# Patient Record
Sex: Female | Born: 1935
Health system: Southern US, Community
[De-identification: ages and names within clinical notes are randomized; demographics above are authoritative.]

## PROBLEM LIST (undated history)

## (undated) DIAGNOSIS — M8580 Other specified disorders of bone density and structure, unspecified site: Secondary | ICD-10-CM

## (undated) DIAGNOSIS — M1990 Unspecified osteoarthritis, unspecified site: Secondary | ICD-10-CM

## (undated) HISTORY — PX: TUBAL LIGATION: SHX77

## (undated) HISTORY — PX: APPENDECTOMY: SHX54

## (undated) HISTORY — DX: Other specified disorders of bone density and structure, unspecified site: M85.80

## (undated) HISTORY — PX: BREAST CYST EXCISION: SHX579

## (undated) HISTORY — PX: BUNIONECTOMY: SHX129

---

## 2001-01-25 ENCOUNTER — Ambulatory Visit (HOSPITAL_COMMUNITY): Admission: RE | Admit: 2001-01-25 | Discharge: 2001-01-25 | Payer: Self-pay | Admitting: Ophthalmology

## 2001-08-22 ENCOUNTER — Ambulatory Visit (HOSPITAL_COMMUNITY): Admission: RE | Admit: 2001-08-22 | Discharge: 2001-08-22 | Payer: Self-pay | Admitting: Family Medicine

## 2001-08-22 ENCOUNTER — Encounter: Payer: Self-pay | Admitting: Family Medicine

## 2002-11-15 ENCOUNTER — Ambulatory Visit (HOSPITAL_COMMUNITY): Admission: RE | Admit: 2002-11-15 | Discharge: 2002-11-15 | Payer: Self-pay | Admitting: Family Medicine

## 2002-11-15 ENCOUNTER — Encounter: Payer: Self-pay | Admitting: Family Medicine

## 2004-01-22 ENCOUNTER — Ambulatory Visit (HOSPITAL_COMMUNITY): Admission: RE | Admit: 2004-01-22 | Discharge: 2004-01-22 | Payer: Self-pay | Admitting: Family Medicine

## 2005-02-09 ENCOUNTER — Ambulatory Visit (HOSPITAL_COMMUNITY): Admission: RE | Admit: 2005-02-09 | Discharge: 2005-02-09 | Payer: Self-pay | Admitting: Family Medicine

## 2006-03-05 ENCOUNTER — Ambulatory Visit (HOSPITAL_COMMUNITY): Admission: RE | Admit: 2006-03-05 | Discharge: 2006-03-05 | Payer: Self-pay | Admitting: Family Medicine

## 2006-05-20 ENCOUNTER — Ambulatory Visit: Payer: Self-pay | Admitting: Internal Medicine

## 2006-05-24 HISTORY — PX: ESOPHAGOGASTRODUODENOSCOPY: SHX1529

## 2006-06-04 ENCOUNTER — Ambulatory Visit: Payer: Self-pay | Admitting: Internal Medicine

## 2006-06-04 ENCOUNTER — Ambulatory Visit (HOSPITAL_COMMUNITY): Admission: RE | Admit: 2006-06-04 | Discharge: 2006-06-04 | Payer: Self-pay | Admitting: Internal Medicine

## 2006-09-29 ENCOUNTER — Ambulatory Visit (HOSPITAL_COMMUNITY): Admission: RE | Admit: 2006-09-29 | Discharge: 2006-09-29 | Payer: Self-pay | Admitting: Family Medicine

## 2007-03-23 ENCOUNTER — Ambulatory Visit (HOSPITAL_COMMUNITY): Admission: RE | Admit: 2007-03-23 | Discharge: 2007-03-23 | Payer: Self-pay | Admitting: Family Medicine

## 2008-03-27 ENCOUNTER — Ambulatory Visit (HOSPITAL_COMMUNITY): Admission: RE | Admit: 2008-03-27 | Discharge: 2008-03-27 | Payer: Self-pay | Admitting: Family Medicine

## 2009-04-16 ENCOUNTER — Ambulatory Visit (HOSPITAL_COMMUNITY): Admission: RE | Admit: 2009-04-16 | Discharge: 2009-04-16 | Payer: Self-pay | Admitting: Family Medicine

## 2010-04-17 ENCOUNTER — Ambulatory Visit (HOSPITAL_COMMUNITY): Admission: RE | Admit: 2010-04-17 | Discharge: 2010-04-17 | Payer: Self-pay | Admitting: Family Medicine

## 2010-07-24 ENCOUNTER — Ambulatory Visit (HOSPITAL_COMMUNITY): Admission: RE | Admit: 2010-07-24 | Discharge: 2010-07-24 | Payer: Self-pay | Admitting: Family Medicine

## 2010-11-12 ENCOUNTER — Ambulatory Visit (HOSPITAL_COMMUNITY)
Admission: RE | Admit: 2010-11-12 | Discharge: 2010-11-12 | Disposition: A | Discharge status: Home / Self Care | Payer: Medicare Other | Source: Ambulatory Visit | Attending: Family Medicine | Admitting: Family Medicine

## 2010-11-12 ENCOUNTER — Other Ambulatory Visit (HOSPITAL_COMMUNITY): Payer: Self-pay | Admitting: Family Medicine

## 2010-11-12 DIAGNOSIS — R05 Cough: Secondary | ICD-10-CM

## 2010-11-12 DIAGNOSIS — R062 Wheezing: Secondary | ICD-10-CM | POA: Insufficient documentation

## 2010-11-12 DIAGNOSIS — R059 Cough, unspecified: Secondary | ICD-10-CM | POA: Insufficient documentation

## 2010-11-12 DIAGNOSIS — R112 Nausea with vomiting, unspecified: Secondary | ICD-10-CM | POA: Insufficient documentation

## 2011-01-09 NOTE — H&P (Signed)
NAME:  Nancy Kirby, Nancy Kirby                ACCOUNT NO.:  [id number]   MEDICAL RECORD NO.:  [id number]          PATIENT TYPE:  AMB   LOCATION:  DAY                           FACILITY:  [facility]   PHYSICIAN:  Lionel December, M.D.    DATE OF BIRTH:  07-Nov-1926   DATE OF ADMISSION:  DATE OF DISCHARGE:  LH                                HISTORY & PHYSICAL   PRESENTING COMPLAINT:  Solid food dysphagia.   HISTORY OF PRESENT ILLNESS:  Heran is a 77 year old Caucasian female  patient of Dr. Lilyan Punt who is self-referred for evaluation of  dysphagia.  She has a history of dysphagia.  She had EGD/ED in [phone].  She had erosive esophagitis with stricture at GE junction which was dilated  to 18 mm with the balloon, and she also had small sliding hiatal hernia.  She had gastritis, but her H pylori serology was negative.  She was treated  initially with Prilosec and asked to continue H2B, but she has been using  PPI on a p.r.n. basis only when she would eat certain foods that might  trigger her heartburn.  She had done well until about two months ago when  she started to have difficulty with rice and meats.  She has had a few  episodes where she had to regurgitate in order to get relief.  She points to  her mid lower sternal level as the site of bolus obstruction.  She  experiences very little in the way of indigestion, only with certain foods.  She denies nocturnal regurgitation or throat symptoms.  She is taking maybe  a dose or two of Nexium per month.  She has a good appetite.  She denies  melena, rectal bleeding or weight loss.  She did undergo screening  sigmoidoscopy by Dr. Sudie Bailey about seven years ago, but she has never had a  screening colonoscopy, although she has been thinking about having one.   She is on Tums p.r.n.   PAST MEDICAL HISTORY:  Chronic GERD as above.  She had normal bone density  study within the last four to five years.  She had screening flexible  sigmoidoscopy seven  years ago.  Prior surgeries include removal of a benign  cyst from the breast, appendectomy, bilateral bunionectomy.  History of  kidney stones.  She passed one several years ago during her pregnancy.  Second time, she had a stone removed via cystoscopy and third time which was  about 30 years ago she had right pyelolithotomy, and she has not had any  problems since then.  She has also had bilateral cataract surgery.   ALLERGIES:  NK.   FAMILY HISTORY:  Father died at 76.  Mother had bladder carcinoma and died  at age 34.  She has two brothers living and one brother died of carcinoma of  the liver at age 31, another one of lung carcinoma at age 32.   SOCIAL HISTORY:  She is married.  She has three healthy children.  She is an  Astronomer.  She worked at [employer] office for over 14 years,  and she worked as  an Psychiatrist.  Presently, she works part-time at Dr[employer] office.  She has never smoked cigarettes or drank alcohol.   PHYSICAL EXAMINATION:  GENERAL:  Pleasant well-developed, well-nourished  Caucasian female who appears younger than stated age.  She weighs 157-1/2  pounds.  She is 5 feet 6 inches tall.  Pulse 72 per minute, blood pressure  106/68, temperature is 98.  HEENT:  Conjunctivae is pink.  Sclerae is nonicteric.  Oral pharyngeal  mucosa is normal.  Dentition in satisfactory condition.  No neck masses or  thyromegaly noted.  CARDIAC EXAM:  With regular rhythm.  Normal S1 and S2.  No murmur or gallop  noted.  LUNGS:  Are clear to auscultation.  ABDOMEN:  Her abdomen is flat and soft without tenderness, organomegaly or  masses.  EXTREMITIES:  She does not have peripheral edema or clubbing.   ASSESSMENT:  Recent onset of solid food dysphagia.  She has history of  erosive esophagitis, and stricture at GE junction was last dilated in  [id number].  She has done quite well given that she has been on p.r.n.  acid suppression.  I am afraid that she needs to undergo esophageal  dilation  again.  Feel she needs to be on chronic PPI therapy rather than p.r.n. use.   She is average risk for colorectal carcinoma, although family history is  significant for various malignancies other than CRC.  Given that she is in  excellent health, she should consider having screening colonoscopy.   RECOMMENDATIONS:  1. Antireflux measures reinforced.  2. Prevacid samples given 30 mg q.a.m. (since we did not have any Nexium).      When she finishes samples, she may want to try Prilosec OTC 20 mg q.d.  3. EGD with ED in the near future at [facility].  4. She will call us when she is ready to undergo screening colonoscopy.      Lionel December, M.D.  Electronically Signed     NR/MEDQ  D:  05/20/2006  T:  05/20/2006  Job:  [id number]   cc:   Lorin Picket A. Gerda Diss, MD  Fax: [phone]   [facility]  Fax: [phone]

## 2011-01-09 NOTE — Op Note (Signed)
NAME:  Nancy Kirby, Nancy Kirby                ACCOUNT NO.:  [id number]   MEDICAL RECORD NO.:  [id number]          PATIENT TYPE:  AMB   LOCATION:  DAY                           FACILITY:  [facility]   PHYSICIAN:  Lionel December, M.D.    DATE OF BIRTH:  [birth year]   DATE OF PROCEDURE:  06/04/2006  DATE OF DISCHARGE:  06/04/2006                                 OPERATIVE REPORT   PROCEDURE:  Esophagogastroduodenoscopy with esophageal dilation.   INDICATION:  [name] is 77 year old Caucasian female with history of  esophageal stricture whose last EGD was in 1999 now presents with dysphagia.  She uses a PPI on a p.r.n. basis.   Procedure risks were reviewed the patient, informed consent was obtained.   MEDS FOR CONSCIOUS SEDATION:  Benzocaine spray for pharyngeal topical  anesthesia, Demerol 50 mg IV, Versed 2 mg IV.   FINDINGS:  Procedure performed in endoscopy suite.  The patient's vital  signs and O2 sat were monitored during the procedure and remained stable.  The patient was placed in left lateral position and Olympus videoscope was  passed via oropharynx without any difficulty into esophagus.   Esophagus:  Mucosa of the esophagus normal.  There is scarring at just above  GE junction which was at 37 cm with stricture right at the GE junction.  Pictures taken for the record.  She had small sliding hiatal hernia.  Diaphragmatic hiatus was at 39 was wide open.   Stomach.  It was empty and distended very well with insufflation.  Folds of  proximal stomach were normal.  Examination of mucosa at body antrum, pyloric  channel as well as angularis, fundus, cardia was normal.   Duodenum.  Bulbar mucosa was normal.  Scope was passed second part of  duodenum where mucosa and folds normal.   Endoscope was pulled back and into the stomach.  Balloon dilator was passed  through the scope.  Guidewire was pushed into gastric lumen.  Endoscope was  withdrawn into body of esophagus and balloon dilator was  positioned across  distal esophagus.  It was initially insufflated to a diameter of 18 mm.  It  resulted in a very small mucosal disruption.  Subsequently it was  insufflated to 19 mm and disrupting the mucosa at GE junction at 3 o'clock  as well as 6 o'clock.  Pictures taken for the record.  Balloon was deflated  withdrawn.  Endoscope was withdrawn.  The patient tolerated the procedure  well.   FINAL DIAGNOSIS:  Esophageal stricture at the GE junction along with small  sliding hiatal hernia.   The stricture was dilated to 19 mm with a balloon.   RECOMMENDATIONS:  Antireflux measures as before.  I would like for [name]  to stay on a PPI every day.  She can either take Prevacid or Nexium or  Prilosec OTC.   She will call us should dysphagia recur.      Lionel December, M.D.  Electronically Signed     NR/MEDQ  D:  06/04/2006  T:  06/07/2006  Job:  [id number]   cc:   Lorin Picket A.  Gerda Diss, MD  Fax: [phone]

## 2011-03-31 ENCOUNTER — Other Ambulatory Visit: Payer: Self-pay | Admitting: Family Medicine

## 2011-03-31 DIAGNOSIS — Z139 Encounter for screening, unspecified: Secondary | ICD-10-CM

## 2011-04-20 ENCOUNTER — Ambulatory Visit (HOSPITAL_COMMUNITY)
Admission: RE | Admit: 2011-04-20 | Discharge: 2011-04-20 | Disposition: A | Discharge status: Home / Self Care | Payer: Medicare Other | Source: Ambulatory Visit | Attending: Family Medicine | Admitting: Family Medicine

## 2011-04-20 DIAGNOSIS — Z1231 Encounter for screening mammogram for malignant neoplasm of breast: Secondary | ICD-10-CM | POA: Insufficient documentation

## 2011-04-20 DIAGNOSIS — Z139 Encounter for screening, unspecified: Secondary | ICD-10-CM

## 2011-05-28 ENCOUNTER — Encounter (INDEPENDENT_AMBULATORY_CARE_PROVIDER_SITE_OTHER): Payer: Self-pay | Admitting: *Deleted

## 2011-08-27 ENCOUNTER — Encounter (INDEPENDENT_AMBULATORY_CARE_PROVIDER_SITE_OTHER): Payer: Self-pay | Admitting: *Deleted

## 2012-05-13 ENCOUNTER — Other Ambulatory Visit: Payer: Self-pay | Admitting: Family Medicine

## 2012-05-13 DIAGNOSIS — Z139 Encounter for screening, unspecified: Secondary | ICD-10-CM

## 2012-05-23 ENCOUNTER — Ambulatory Visit (HOSPITAL_COMMUNITY)
Admission: RE | Admit: 2012-05-23 | Discharge: 2012-05-23 | Disposition: A | Discharge status: Home / Self Care | Payer: Medicare Other | Source: Ambulatory Visit | Attending: Family Medicine | Admitting: Family Medicine

## 2012-05-23 DIAGNOSIS — Z139 Encounter for screening, unspecified: Secondary | ICD-10-CM

## 2012-05-23 DIAGNOSIS — Z1231 Encounter for screening mammogram for malignant neoplasm of breast: Secondary | ICD-10-CM | POA: Insufficient documentation

## 2012-06-02 DIAGNOSIS — Z23 Encounter for immunization: Secondary | ICD-10-CM | POA: Diagnosis not present

## 2013-05-17 DIAGNOSIS — Z23 Encounter for immunization: Secondary | ICD-10-CM | POA: Diagnosis not present

## 2013-05-25 ENCOUNTER — Other Ambulatory Visit: Payer: Self-pay | Admitting: Family Medicine

## 2013-05-25 DIAGNOSIS — Z139 Encounter for screening, unspecified: Secondary | ICD-10-CM

## 2013-05-30 ENCOUNTER — Ambulatory Visit (HOSPITAL_COMMUNITY)
Admission: RE | Admit: 2013-05-30 | Discharge: 2013-05-30 | Disposition: A | Discharge status: Home / Self Care | Payer: Medicare Other | Source: Ambulatory Visit | Attending: Family Medicine | Admitting: Family Medicine

## 2013-05-30 DIAGNOSIS — Z1231 Encounter for screening mammogram for malignant neoplasm of breast: Secondary | ICD-10-CM | POA: Diagnosis not present

## 2013-05-30 DIAGNOSIS — Z139 Encounter for screening, unspecified: Secondary | ICD-10-CM

## 2014-05-02 ENCOUNTER — Telehealth: Payer: Self-pay | Admitting: Family Medicine

## 2014-05-02 DIAGNOSIS — R5383 Other fatigue: Secondary | ICD-10-CM

## 2014-05-02 DIAGNOSIS — Z0189 Encounter for other specified special examinations: Secondary | ICD-10-CM

## 2014-05-02 DIAGNOSIS — Z79899 Other long term (current) drug therapy: Secondary | ICD-10-CM

## 2014-05-02 DIAGNOSIS — H538 Other visual disturbances: Secondary | ICD-10-CM | POA: Diagnosis not present

## 2014-05-02 DIAGNOSIS — Z961 Presence of intraocular lens: Secondary | ICD-10-CM | POA: Diagnosis not present

## 2014-05-02 DIAGNOSIS — R5381 Other malaise: Secondary | ICD-10-CM

## 2014-05-02 DIAGNOSIS — H3410 Central retinal artery occlusion, unspecified eye: Secondary | ICD-10-CM | POA: Diagnosis not present

## 2014-05-02 NOTE — Telephone Encounter (Signed)
Patient was notified that blood work has been ordered.  

## 2014-05-02 NOTE — Telephone Encounter (Signed)
Talk with pt to see if any specific concerns.(may need additional labs) I have no meds listed, not sure if that's right? Lipid,liver,met 7 , cbc then f/u ov

## 2014-05-02 NOTE — Telephone Encounter (Signed)
Patient needs order for blood work. °

## 2014-05-02 NOTE — Telephone Encounter (Signed)
No labs since Epic.  

## 2014-05-03 ENCOUNTER — Other Ambulatory Visit: Payer: Self-pay | Admitting: Family Medicine

## 2014-05-03 DIAGNOSIS — Z1231 Encounter for screening mammogram for malignant neoplasm of breast: Secondary | ICD-10-CM

## 2014-05-04 DIAGNOSIS — R5381 Other malaise: Secondary | ICD-10-CM | POA: Diagnosis not present

## 2014-05-04 DIAGNOSIS — Z0189 Encounter for other specified special examinations: Secondary | ICD-10-CM | POA: Diagnosis not present

## 2014-05-04 DIAGNOSIS — R5383 Other fatigue: Secondary | ICD-10-CM | POA: Diagnosis not present

## 2014-05-04 DIAGNOSIS — M899 Disorder of bone, unspecified: Secondary | ICD-10-CM | POA: Diagnosis not present

## 2014-05-04 DIAGNOSIS — Z79899 Other long term (current) drug therapy: Secondary | ICD-10-CM | POA: Diagnosis not present

## 2014-05-04 DIAGNOSIS — M949 Disorder of cartilage, unspecified: Secondary | ICD-10-CM | POA: Diagnosis not present

## 2014-05-04 LAB — CBC WITH DIFFERENTIAL/PLATELET
BASOS PCT: 1 % (ref 0–1)
Basophils Absolute: 0.1 10*3/uL (ref 0.0–0.1)
EOS ABS: 0.4 10*3/uL (ref 0.0–0.7)
EOS PCT: 5 % (ref 0–5)
HEMATOCRIT: 36.5 % (ref 36.0–46.0)
Hemoglobin: 12.3 g/dL (ref 12.0–15.0)
LYMPHS ABS: 1.9 10*3/uL (ref 0.7–4.0)
Lymphocytes Relative: 27 % (ref 12–46)
MCH: 31 pg (ref 26.0–34.0)
MCHC: 33.7 g/dL (ref 30.0–36.0)
MCV: 91.9 fL (ref 78.0–100.0)
MONO ABS: 0.6 10*3/uL (ref 0.1–1.0)
MONOS PCT: 9 % (ref 3–12)
NEUTROS ABS: 4.2 10*3/uL (ref 1.7–7.7)
Neutrophils Relative %: 58 % (ref 43–77)
Platelets: 325 10*3/uL (ref 150–400)
RBC: 3.97 MIL/uL (ref 3.87–5.11)
RDW: 14.2 % (ref 11.5–15.5)
WBC: 7.2 10*3/uL (ref 4.0–10.5)

## 2014-05-04 LAB — BASIC METABOLIC PANEL
BUN: 15 mg/dL (ref 6–23)
CO2: 28 meq/L (ref 19–32)
CREATININE: 0.79 mg/dL (ref 0.50–1.10)
Calcium: 8.5 mg/dL (ref 8.4–10.5)
Chloride: 107 mEq/L (ref 96–112)
Glucose, Bld: 83 mg/dL (ref 70–99)
Potassium: 4.3 mEq/L (ref 3.5–5.3)
SODIUM: 140 meq/L (ref 135–145)

## 2014-05-04 LAB — HEPATIC FUNCTION PANEL
ALBUMIN: 3.7 g/dL (ref 3.5–5.2)
ALT: 9 U/L (ref 0–35)
AST: 17 U/L (ref 0–37)
Alkaline Phosphatase: 82 U/L (ref 39–117)
BILIRUBIN DIRECT: 0.1 mg/dL (ref 0.0–0.3)
BILIRUBIN TOTAL: 0.5 mg/dL (ref 0.2–1.2)
Indirect Bilirubin: 0.4 mg/dL (ref 0.2–1.2)
TOTAL PROTEIN: 5.7 g/dL — AB (ref 6.0–8.3)

## 2014-05-04 LAB — LIPID PANEL
CHOLESTEROL: 188 mg/dL (ref 0–200)
HDL: 52 mg/dL (ref >39)
LDL Cholesterol: 117 mg/dL — ABNORMAL HIGH (ref 0–99)
TRIGLYCERIDES: 95 mg/dL (ref <150)
Total CHOL/HDL Ratio: 3.6 Ratio
VLDL: 19 mg/dL (ref 0–40)

## 2014-05-05 LAB — VITAMIN D 25 HYDROXY (VIT D DEFICIENCY, FRACTURES): VIT D 25 HYDROXY: 51 ng/mL (ref 30–89)

## 2014-05-14 ENCOUNTER — Encounter: Payer: Self-pay | Admitting: Family Medicine

## 2014-05-14 ENCOUNTER — Ambulatory Visit (INDEPENDENT_AMBULATORY_CARE_PROVIDER_SITE_OTHER): Payer: Medicare Other | Admitting: Family Medicine

## 2014-05-14 VITALS — BP 130/80 | Ht 63.5 in | Wt 145.5 lb

## 2014-05-14 DIAGNOSIS — H3410 Central retinal artery occlusion, unspecified eye: Secondary | ICD-10-CM | POA: Diagnosis not present

## 2014-05-14 DIAGNOSIS — H3412 Central retinal artery occlusion, left eye: Secondary | ICD-10-CM

## 2014-05-14 DIAGNOSIS — Z23 Encounter for immunization: Secondary | ICD-10-CM

## 2014-05-14 DIAGNOSIS — I639 Cerebral infarction, unspecified: Secondary | ICD-10-CM

## 2014-05-14 DIAGNOSIS — I6350 Cerebral infarction due to unspecified occlusion or stenosis of unspecified cerebral artery: Secondary | ICD-10-CM | POA: Diagnosis not present

## 2014-05-14 DIAGNOSIS — E785 Hyperlipidemia, unspecified: Secondary | ICD-10-CM | POA: Diagnosis not present

## 2014-05-14 DIAGNOSIS — Z0000 Encounter for general adult medical examination without abnormal findings: Secondary | ICD-10-CM | POA: Diagnosis not present

## 2014-05-14 MED ORDER — PRAVASTATIN SODIUM 10 MG PO TABS: 10.0000 mg | ORAL_TABLET | Freq: Every day | ORAL | Status: DC

## 2014-05-14 NOTE — Progress Notes (Signed)
Subjective:    Patient ID: Nancy Kirby, female    DOB: 1927/04/13, 80 y.o.   MRN: [id number]  HPI AWV- Annual Wellness Visit  The patient was seen for their annual wellness visit. The patient's past medical history, surgical history, and family history were reviewed. Pertinent vaccines were reviewed ( tetanus, pneumonia, shingles, flu) The patient's medication list was reviewed and updated.  The height and weight were entered. The patient's current BMI is: 25.37  Cognitive screening was completed. Outcome of Mini - Cog: passed  Falls within the past 6 months: none  Current tobacco usage: non-smoker (All patients who use tobacco were given written and verbal information on quitting)  Recent listing of emergency department/hospitalizations over the past year were reviewed.  current specialist the patient sees on a regular basis: none   Medicare annual wellness visit patient questionnaire was reviewed.  A written screening schedule for the patient for the next 5-10 years was given. Appropriate discussion of followup regarding next visit was discussed.  Patient states she does not think she needs a pap smear or exam due to age.   Patient states that she has seen a eye specialist due to vision loss in the left eye and she was told she had a stroke in the left eye.  Happened 3 weeks ago.  Review of Systems  Constitutional: Negative for activity change, appetite change and fatigue.  HENT: Negative for congestion, ear discharge and rhinorrhea.   Eyes: Negative for discharge.       She relates visual defect called the left eye was seen by ophthalmologist they diagnosed a retinal thrombosis that may referred to as a stroke  Respiratory: Negative for cough, chest tightness and wheezing.   Cardiovascular: Negative for chest pain.  Gastrointestinal: Negative for vomiting and abdominal pain.  Genitourinary: Negative for frequency and difficulty urinating.  Musculoskeletal: Negative  for neck pain.  Allergic/Immunologic: Negative for environmental allergies and food allergies.  Neurological: Negative for weakness and headaches.  Psychiatric/Behavioral: Negative for behavioral problems and agitation.       Objective:   Physical Exam  Constitutional: She is oriented to person, place, and time. She appears well-developed and well-nourished.  HENT:  Head: Normocephalic.  Right Ear: External ear normal.  Left Ear: External ear normal.  Neck: Normal range of motion. No thyromegaly present.  Cardiovascular: Normal rate, regular rhythm, normal heart sounds and intact distal pulses.   No murmur heard. Pulmonary/Chest: Effort normal and breath sounds normal. No respiratory distress. She has no wheezes.  Abdominal: Soft. Bowel sounds are normal. She exhibits no distension and no mass. There is no tenderness.  Musculoskeletal: Normal range of motion. She exhibits no edema and no tenderness.  Lymphadenopathy:    She has no cervical adenopathy.  Neurological: She is alert and oriented to person, place, and time. She exhibits normal muscle tone.  Skin: Skin is warm and dry.  Psychiatric: She has a normal mood and affect. Her behavior is normal.          Assessment & Plan:  Bone density in 2016  Annual wellness visit-patient doing well cognitive. Also doing well with driving. Immunizations reviewed and updated. Hemoccult cards x3 recommended. Because of age do not recommend colonoscopy. Mammogram not recommended.  History of retinal stroke-I recommend carotid Doppler studies. Early next year and I would recommend MRI of the brain patient does not want to do it currently.  Hyperlipidemia start pravastatin 10 mg a day if any problems notify us I  recommend rechecking cholesterol in early 2016

## 2014-05-18 ENCOUNTER — Ambulatory Visit (HOSPITAL_COMMUNITY)
Admission: RE | Admit: 2014-05-18 | Discharge: 2014-05-18 | Disposition: A | Discharge status: Home / Self Care | Payer: Medicare Other | Source: Ambulatory Visit | Attending: Family Medicine | Admitting: Family Medicine

## 2014-05-18 DIAGNOSIS — I6350 Cerebral infarction due to unspecified occlusion or stenosis of unspecified cerebral artery: Secondary | ICD-10-CM | POA: Diagnosis not present

## 2014-05-18 DIAGNOSIS — I6529 Occlusion and stenosis of unspecified carotid artery: Secondary | ICD-10-CM | POA: Diagnosis not present

## 2014-05-18 LAB — POC HEMOCCULT BLD/STL (HOME/3-CARD/SCREEN)
Card #2 Fecal Occult Blod, POC: NEGATIVE
FECAL OCCULT BLD: NEGATIVE
FECAL OCCULT BLD: NEGATIVE

## 2014-06-01 ENCOUNTER — Ambulatory Visit (HOSPITAL_COMMUNITY): Payer: BC Managed Care – PPO

## 2014-07-30 ENCOUNTER — Ambulatory Visit (HOSPITAL_COMMUNITY)
Admission: RE | Admit: 2014-07-30 | Discharge: 2014-07-30 | Disposition: A | Discharge status: Home / Self Care | Payer: Medicare Other | Source: Ambulatory Visit | Attending: Family Medicine | Admitting: Family Medicine

## 2014-07-30 DIAGNOSIS — Z1231 Encounter for screening mammogram for malignant neoplasm of breast: Secondary | ICD-10-CM | POA: Diagnosis not present

## 2014-09-10 ENCOUNTER — Telehealth: Payer: Self-pay | Admitting: Family Medicine

## 2014-09-10 DIAGNOSIS — I639 Cerebral infarction, unspecified: Secondary | ICD-10-CM

## 2014-09-10 DIAGNOSIS — E785 Hyperlipidemia, unspecified: Secondary | ICD-10-CM

## 2014-09-10 DIAGNOSIS — Z79899 Other long term (current) drug therapy: Secondary | ICD-10-CM

## 2014-09-10 NOTE — Telephone Encounter (Signed)
Pt states last sept she had a stroke in her left eye and that you wanted to do MRI but that it could wait til January. Also pt requesting lipid because it was elevated last time. [facility]. Pt advised Dr. Lorin PicketScott will be back on Wednesday to get message.

## 2014-09-10 NOTE — Telephone Encounter (Signed)
Pt is requesting labs and MRI order sent over per Dr. Lorin PicketScott.

## 2014-09-11 NOTE — Telephone Encounter (Signed)
Please order lipid liver profile due to hyperlipidemia. Also order MRI of the brain due to retinal stroke as well as peripheral field deficit. With cerebrovascular accident also as a diagnosis. The patient should be scheduled an office visit approximately 1 week after MRI is completed. Make sure patient does blood work before being seen.

## 2014-09-12 NOTE — Addendum Note (Signed)
Addended byOneal Deputy: Keziyah Kneale D on: 09/12/2014 09:37 AM   Modules accepted: Orders

## 2014-09-12 NOTE — Telephone Encounter (Signed)
Patient notified and verbalized understanding. MRI schedule, BW ordered, and transferred up front to schedule f/u with Dr. Lorin PicketScott.

## 2014-09-18 ENCOUNTER — Ambulatory Visit (HOSPITAL_COMMUNITY)
Admission: RE | Admit: 2014-09-18 | Discharge: 2014-09-18 | Disposition: A | Discharge status: Home / Self Care | Payer: Medicare Other | Source: Ambulatory Visit | Attending: Family Medicine | Admitting: Family Medicine

## 2014-09-18 DIAGNOSIS — G319 Degenerative disease of nervous system, unspecified: Secondary | ICD-10-CM | POA: Diagnosis not present

## 2014-09-18 DIAGNOSIS — H538 Other visual disturbances: Secondary | ICD-10-CM | POA: Diagnosis not present

## 2014-09-18 DIAGNOSIS — I639 Cerebral infarction, unspecified: Secondary | ICD-10-CM | POA: Insufficient documentation

## 2014-09-18 DIAGNOSIS — I6782 Cerebral ischemia: Secondary | ICD-10-CM | POA: Diagnosis not present

## 2014-09-18 LAB — HEPATIC FUNCTION PANEL
ALK PHOS: 79 U/L (ref 39–117)
ALT: 12 U/L (ref 0–35)
AST: 19 U/L (ref 0–37)
Albumin: 3.8 g/dL (ref 3.5–5.2)
BILIRUBIN DIRECT: 0.1 mg/dL (ref 0.0–0.3)
Indirect Bilirubin: 0.6 mg/dL (ref 0.2–1.2)
TOTAL PROTEIN: 6 g/dL (ref 6.0–8.3)
Total Bilirubin: 0.7 mg/dL (ref 0.2–1.2)

## 2014-09-18 LAB — LIPID PANEL
CHOL/HDL RATIO: 3.4 ratio
Cholesterol: 191 mg/dL (ref 0–200)
HDL: 56 mg/dL (ref >39)
LDL Cholesterol: 117 mg/dL — ABNORMAL HIGH (ref 0–99)
Triglycerides: 90 mg/dL (ref <150)
VLDL: 18 mg/dL (ref 0–40)

## 2014-09-24 ENCOUNTER — Encounter: Payer: Self-pay | Admitting: Family Medicine

## 2014-09-24 ENCOUNTER — Ambulatory Visit (INDEPENDENT_AMBULATORY_CARE_PROVIDER_SITE_OTHER): Payer: Medicare Other | Admitting: Family Medicine

## 2014-09-24 VITALS — BP 110/70 | Ht 63.5 in | Wt 149.5 lb

## 2014-09-24 DIAGNOSIS — G4700 Insomnia, unspecified: Secondary | ICD-10-CM

## 2014-09-24 DIAGNOSIS — E785 Hyperlipidemia, unspecified: Secondary | ICD-10-CM

## 2014-09-24 MED ORDER — PRAVASTATIN SODIUM 10 MG PO TABS: 10.0000 mg | ORAL_TABLET | Freq: Every day | ORAL | Status: DC

## 2014-09-24 NOTE — Patient Instructions (Signed)
Insomnia Insomnia is frequent trouble falling and/or staying asleep. Insomnia can be a long term problem or a short term problem. Both are common. Insomnia can be a short term problem when the wakefulness is related to a certain stress or worry. Long term insomnia is often related to ongoing stress during waking hours and/or poor sleeping habits. Overtime, sleep deprivation itself can make the problem worse. Every little thing feels more severe because you are overtired and your ability to cope is decreased. CAUSES   Stress, anxiety, and depression.  Poor sleeping habits.  Distractions such as TV in the bedroom.  Naps close to bedtime.  Engaging in emotionally charged conversations before bed.  Technical reading before sleep.  Alcohol and other sedatives. They may make the problem worse. They can hurt normal sleep patterns and normal dream activity.  Stimulants such as caffeine for several hours prior to bedtime.  Pain syndromes and shortness of breath can cause insomnia.  Exercise late at night.  Changing time zones may cause sleeping problems (jet lag). It is sometimes helpful to have someone observe your sleeping patterns. They should look for periods of not breathing during the night (sleep apnea). They should also look to see how long those periods last. If you live alone or observers are uncertain, you can also be observed at a sleep clinic where your sleep patterns will be professionally monitored. Sleep apnea requires a checkup and treatment. Give your caregivers your medical history. Give your caregivers observations your family has made about your sleep.  SYMPTOMS   Not feeling rested in the morning.  Anxiety and restlessness at bedtime.  Difficulty falling and staying asleep. TREATMENT   Your caregiver may prescribe treatment for an underlying medical disorders. Your caregiver can give advice or help if you are using alcohol or other drugs for self-medication. Treatment  of underlying problems will usually eliminate insomnia problems.  Medications can be prescribed for short time use. They are generally not recommended for lengthy use.  Over-the-counter sleep medicines are not recommended for lengthy use. They can be habit forming.  You can promote easier sleeping by making lifestyle changes such as:  Using relaxation techniques that help with breathing and reduce muscle tension.  Exercising earlier in the day.  Changing your diet and the time of your last meal. No night time snacks.  Establish a regular time to go to bed.  Counseling can help with stressful problems and worry.  Soothing music and white noise may be helpful if there are background noises you cannot remove.  Stop tedious detailed work at least one hour before bedtime. HOME CARE INSTRUCTIONS   Keep a diary. Inform your caregiver about your progress. This includes any medication side effects. See your caregiver regularly. Take note of:  Times when you are asleep.  Times when you are awake during the night.  The quality of your sleep.  How you feel the next day. This information will help your caregiver care for you.  Get out of bed if you are still awake after 15 minutes. Read or do some quiet activity. Keep the lights down. Wait until you feel sleepy and go back to bed.  Keep regular sleeping and waking hours. Avoid naps.  Exercise regularly.  Avoid distractions at bedtime. Distractions include watching television or engaging in any intense or detailed activity like attempting to balance the household checkbook.  Develop a bedtime ritual. Keep a familiar routine of bathing, brushing your teeth, climbing into bed at the same   time each night, listening to soothing music. Routines increase the success of falling to sleep faster.  Use relaxation techniques. This can be using breathing and muscle tension release routines. It can also include visualizing peaceful scenes. You can  also help control troubling or intruding thoughts by keeping your mind occupied with boring or repetitive thoughts like the old concept of counting sheep. You can make it more creative like imagining planting one beautiful flower after another in your backyard garden.  During your day, work to eliminate stress. When this is not possible use some of the previous suggestions to help reduce the anxiety that accompanies stressful situations. MAKE SURE YOU:   Understand these instructions.  Will watch your condition.  Will get help right away if you are not doing well or get worse. Document Released: 08/07/2000 Document Revised: 11/02/2011 Document Reviewed: 09/07/2007 Exit[employer] Patient Information 2015 [employer]. This information is not intended to replace advice given to you by your health care provider. Make sure you discuss any questions you have with your health care provider.  

## 2014-09-24 NOTE — Progress Notes (Signed)
   Subjective:    Patient ID: Nancy Kirby, female    DOB: 01-04-1927, 81 y.o.   MRN: [id number]  Hyperlipidemia This is a chronic problem. The current episode started more than 1 year ago. The problem is controlled. There are no known factors aggravating her hyperlipidemia. Current antihyperlipidemic treatment includes statins. The current treatment provides significant improvement of lipids. There are no compliance problems.  There are no known risk factors for coronary artery disease.  Patient has blood work done and results are in the system. Patient is also here to follow up on her recent MRI report.   Patient states she would like a mild sedative for sleep issues. Patient has time where it's been difficult to sleep she also has time where she feels restless she denies any problems with depression.   Review of Systems See above no shortness breath no chest tightness no abdominal pain no nausea vomiting diarrhea.    Objective:   Physical Exam  Lungs are clear hearts regular pulse normal abdomen soft extremities no edema blood pressure good neurologic grossly normal      Assessment & Plan:  #1 hyperlipidemia fairly good control patient does not want to go up on the dose of medication #2 recent MRI shows microvascular changes heard ultrasound of carotid had looked good several months back Blood pressure good today Insomnia we discussed nonprescription ways of dealing with it it would be okay for her to try half of a tablet Benadryl at night if necessary or 1 whole one if necessary  Patient defers bone density

## 2014-11-12 ENCOUNTER — Encounter: Payer: Self-pay | Admitting: *Deleted

## 2014-12-12 ENCOUNTER — Encounter: Payer: Self-pay | Admitting: Family Medicine

## 2014-12-12 ENCOUNTER — Ambulatory Visit (INDEPENDENT_AMBULATORY_CARE_PROVIDER_SITE_OTHER): Payer: Medicare Other | Admitting: Family Medicine

## 2014-12-12 VITALS — BP 110/76 | Temp 98.4°F | Ht 65.5 in | Wt 148.0 lb

## 2014-12-12 DIAGNOSIS — H6122 Impacted cerumen, left ear: Secondary | ICD-10-CM

## 2014-12-12 DIAGNOSIS — J069 Acute upper respiratory infection, unspecified: Secondary | ICD-10-CM | POA: Diagnosis not present

## 2014-12-12 NOTE — Progress Notes (Signed)
   Subjective:    Patient ID: Landis GandyLillian L Myer, female    DOB: [birth year], 81 y.o.   MRN: [id number]  HPILeft ear stopped up. No pain. Unable to hear well. Had some sinus symptoms about 1 week ago. None now.   Patient relates head congestion drainage coughing not feeling good but then that gradually got better but now she just hear out of her left ear she is worried that is fluid behind the eardrum and she still states some sinus congestion but no nasty drainage that has cleared up  Review of Systems  Constitutional: Negative for fever and activity change.  HENT: Positive for rhinorrhea. Negative for congestion and ear pain.   Eyes: Negative for discharge.  Respiratory: Negative for cough, shortness of breath and wheezing.   Cardiovascular: Negative for chest pain.       Objective:   Physical Exam  Constitutional: She appears well-developed.  HENT:  Head: Normocephalic.  Nose: Nose normal.  Mouth/Throat: Oropharynx is clear and moist. No oropharyngeal exudate.  Cerumen impaction left ear  Neck: Neck supple.  Cardiovascular: Normal rate and normal heart sounds.   No murmur heard. Pulmonary/Chest: Effort normal and breath sounds normal. She has no wheezes.  Lymphadenopathy:    She has no cervical adenopathy.  Skin: Skin is warm and dry.  Nursing note and vitals reviewed.         Assessment & Plan:  URI Cerumen impaction-persistant, offered ENT pt defers No ATX currently Warnings discussed

## 2014-12-13 ENCOUNTER — Other Ambulatory Visit: Payer: Self-pay | Admitting: *Deleted

## 2014-12-13 DIAGNOSIS — H6122 Impacted cerumen, left ear: Secondary | ICD-10-CM

## 2015-05-17 ENCOUNTER — Telehealth: Payer: Self-pay | Admitting: Family Medicine

## 2015-05-17 DIAGNOSIS — Z79899 Other long term (current) drug therapy: Secondary | ICD-10-CM

## 2015-05-17 DIAGNOSIS — Z780 Asymptomatic menopausal state: Secondary | ICD-10-CM

## 2015-05-17 DIAGNOSIS — E785 Hyperlipidemia, unspecified: Secondary | ICD-10-CM

## 2015-05-17 NOTE — Telephone Encounter (Signed)
Pt has wellness 10/27 would like to go ahead an do labs   Last labs  09/18/14 Lip, Hep

## 2015-05-17 NOTE — Telephone Encounter (Signed)
Lipid, liver, vitamin D, metabolic 7

## 2015-05-17 NOTE — Telephone Encounter (Signed)
Orders ready. Pt notified.  

## 2015-06-20 ENCOUNTER — Encounter: Payer: Medicare Other | Admitting: Family Medicine

## 2015-06-20 LAB — HEPATIC FUNCTION PANEL
ALBUMIN: 4 g/dL (ref 3.5–4.7)
ALT: 12 IU/L (ref 0–32)
AST: 19 IU/L (ref 0–40)
Alkaline Phosphatase: 81 IU/L (ref 39–117)
BILIRUBIN TOTAL: 0.5 mg/dL (ref 0.0–1.2)
Bilirubin, Direct: 0.13 mg/dL (ref 0.00–0.40)
TOTAL PROTEIN: 6.2 g/dL (ref 6.0–8.5)

## 2015-06-20 LAB — BASIC METABOLIC PANEL
BUN/Creatinine Ratio: 18 (ref 11–26)
BUN: 15 mg/dL (ref 8–27)
CALCIUM: 9.4 mg/dL (ref 8.7–10.3)
CO2: 27 mmol/L (ref 18–29)
CREATININE: 0.85 mg/dL (ref 0.57–1.00)
Chloride: 102 mmol/L (ref 97–106)
GFR, EST AFRICAN AMERICAN: 72 mL/min/{1.73_m2} (ref >59)
GFR, EST NON AFRICAN AMERICAN: 62 mL/min/{1.73_m2} (ref >59)
Glucose: 95 mg/dL (ref 65–99)
Potassium: 4.4 mmol/L (ref 3.5–5.2)
Sodium: 142 mmol/L (ref 136–144)

## 2015-06-20 LAB — VITAMIN D 25 HYDROXY (VIT D DEFICIENCY, FRACTURES): Vit D, 25-Hydroxy: 42.4 ng/mL (ref 30.0–100.0)

## 2015-06-20 LAB — LIPID PANEL
CHOL/HDL RATIO: 3.4 ratio (ref 0.0–4.4)
CHOLESTEROL TOTAL: 201 mg/dL — AB (ref 100–199)
HDL: 59 mg/dL (ref >39)
LDL CALC: 119 mg/dL — AB (ref 0–99)
Triglycerides: 115 mg/dL (ref 0–149)
VLDL Cholesterol Cal: 23 mg/dL (ref 5–40)

## 2015-06-21 ENCOUNTER — Other Ambulatory Visit: Payer: Self-pay | Admitting: Family Medicine

## 2015-06-28 ENCOUNTER — Encounter: Payer: Self-pay | Admitting: Family Medicine

## 2015-06-28 ENCOUNTER — Ambulatory Visit (INDEPENDENT_AMBULATORY_CARE_PROVIDER_SITE_OTHER): Payer: Medicare Other | Admitting: Family Medicine

## 2015-06-28 VITALS — BP 102/72 | Ht 65.5 in | Wt 147.6 lb

## 2015-06-28 DIAGNOSIS — E785 Hyperlipidemia, unspecified: Secondary | ICD-10-CM

## 2015-06-28 DIAGNOSIS — Z23 Encounter for immunization: Secondary | ICD-10-CM | POA: Diagnosis not present

## 2015-06-28 DIAGNOSIS — Z0000 Encounter for general adult medical examination without abnormal findings: Secondary | ICD-10-CM

## 2015-06-28 MED ORDER — PRAVASTATIN SODIUM 20 MG PO TABS: 20.0000 mg | ORAL_TABLET | Freq: Every day | ORAL | Status: DC

## 2015-06-28 NOTE — Progress Notes (Signed)
   Subjective:    Patient ID: Nancy Kirby, female    DOB: [birth year], 81 y.o.   MRN: [id number]  HPI  AWV- Annual Wellness Visit  The patient was seen for their annual wellness visit. The patient's past medical history, surgical history, and family history were reviewed. Pertinent vaccines were reviewed ( tetanus, pneumonia, shingles, flu) The patient's medication list was reviewed and updated.  The height and weight were entered. The patient's current BMI is: 24  Cognitive screening was completed. Outcome of Mini - Cog: pass  Falls within the past 6 months:none  Current tobacco usage: none (All patients who use tobacco were given written and verbal information on quitting)  Recent listing of emergency department/hospitalizations over the past year were reviewed.  current specialist the patient sees on a regular basis: no   Medicare annual wellness visit patient questionnaire was reviewed.  A written screening schedule for the patient for the next 5-10 years was given. Appropriate discussion of followup regarding next visit was discussed.      Review of Systems  Constitutional: Negative for activity change, appetite change and fatigue.  HENT: Negative for congestion, ear discharge and rhinorrhea.   Eyes: Negative for discharge.  Respiratory: Negative for cough, chest tightness and wheezing.   Cardiovascular: Negative for chest pain.  Gastrointestinal: Negative for vomiting and abdominal pain.  Genitourinary: Negative for frequency and difficulty urinating.  Musculoskeletal: Negative for neck pain.  Allergic/Immunologic: Negative for environmental allergies and food allergies.  Neurological: Negative for weakness and headaches.  Psychiatric/Behavioral: Negative for behavioral problems and agitation.       Objective:   Physical Exam  Constitutional: She is oriented to person, place, and time. She appears well-developed and well-nourished.  HENT:  Head:  Normocephalic.  Right Ear: External ear normal.  Left Ear: External ear normal.  Eyes: Pupils are equal, round, and reactive to light.  Neck: Normal range of motion. No thyromegaly present.  Cardiovascular: Normal rate, regular rhythm, normal heart sounds and intact distal pulses.   No murmur heard. Pulmonary/Chest: Effort normal and breath sounds normal. No respiratory distress. She has no wheezes.  Abdominal: Soft. Bowel sounds are normal. She exhibits no distension and no mass. There is no tenderness.  Musculoskeletal: Normal range of motion. She exhibits no edema or tenderness.  Lymphadenopathy:    She has no cervical adenopathy.  Neurological: She is alert and oriented to person, place, and time. She exhibits normal muscle tone.  Skin: Skin is warm and dry.  Psychiatric: She has a normal mood and affect. Her behavior is normal.    I went over the lab work with the patient her LDL is higher than what we would like to see a recommend increase pravastatin.   patient denies any cognitive problems she passes her tests no falls no depression  Long discussion held with patient regarding preventative health including bone density colonoscopy and mammograms these are not recommended based on her age and risk factors.   she does take 81 mg aspirin tolerating that well is had normal Hemoccult cards in the past.     Assessment & Plan:   wellness- I recommended shingles vaccine.   flu vaccine given today Hyperlipidemia increase pravastatin repeat lipid liver in 2-3 months Follow up one year

## 2015-09-23 ENCOUNTER — Ambulatory Visit (HOSPITAL_COMMUNITY)
Admission: RE | Admit: 2015-09-23 | Discharge: 2015-09-23 | Disposition: A | Discharge status: Home / Self Care | Payer: Medicare Other | Source: Ambulatory Visit | Attending: Ophthalmology | Admitting: Ophthalmology

## 2015-09-23 ENCOUNTER — Encounter (HOSPITAL_COMMUNITY): Payer: Self-pay

## 2015-09-23 ENCOUNTER — Encounter (HOSPITAL_COMMUNITY)
Admission: RE | Disposition: A | Discharge status: Home / Self Care | Payer: Self-pay | Source: Ambulatory Visit | Attending: Ophthalmology

## 2015-09-23 DIAGNOSIS — Z7982 Long term (current) use of aspirin: Secondary | ICD-10-CM | POA: Insufficient documentation

## 2015-09-23 DIAGNOSIS — H2640 Unspecified secondary cataract: Secondary | ICD-10-CM | POA: Diagnosis not present

## 2015-09-23 HISTORY — DX: Unspecified osteoarthritis, unspecified site: M19.90

## 2015-09-23 HISTORY — PX: YAG LASER APPLICATION: SHX6189

## 2015-09-23 SURGERY — TREATMENT, USING YAG LASER
Anesthesia: LOCAL | Laterality: Left

## 2015-09-23 MED ORDER — TETRACAINE HCL 0.5 % OP SOLN
1.0000 [drp] | Freq: Once | OPHTHALMIC | Status: AC
  Administered 2015-09-23: 1 [drp] via OPHTHALMIC

## 2015-09-23 MED ORDER — TROPICAMIDE 1 % OP SOLN
OPHTHALMIC | Status: AC
  Filled 2015-09-23: qty 3

## 2015-09-23 MED ORDER — TROPICAMIDE 1 % OP SOLN
1.0000 [drp] | Freq: Once | OPHTHALMIC | Status: AC
  Administered 2015-09-23: 1 [drp] via OPHTHALMIC

## 2015-09-23 MED ORDER — TETRACAINE HCL 0.5 % OP SOLN
OPHTHALMIC | Status: AC
  Filled 2015-09-23: qty 4

## 2015-09-23 NOTE — Brief Op Note (Signed)
Nancy Kirby 09/23/2015  Susa Simmonds, MD  Pre-op Diagnosis:  secondary cataract left eye  Post-op Diagnosis:  same  Yag laser self-test completed: Yes.    Indications:  See scanned office H&P for detailed indications  Procedure: YAG posterior capsulotomy OS  Eye protection worn by staff:  Yes.   Laser In Use sign on door:  Yes.    Laser:  {LUMENIS YAG/SLT LASER  Power Setting:  1.7 mJ/burst Anatomical site treated:  Posterior capsule OS Number of applications:  52 Total energy delivered: 84.10 mJ Results:  Open visual axis OS  Patient was instructed to go to the office, as previously scheduled, for intraocular pressure:  No.  Patient verbalizes understanding of discharge instructions:  Yes.    Notes: Pt tolerated procedure well without complicatBreella Vanostrandon

## 2015-09-23 NOTE — Discharge Instructions (Signed)
Selenne Coggin Chrisman  09/23/2015     Instructions    Activity: No Restrictions.   Diet: Resume Diet you were on at home.   Pain Medication: Tylenol if Needed.   CONTACT YOUR DOCTOR IF YOU HAVE PAIN, REDNESS IN YOUR EYE, OR DECREASED VISION.   Follow-up:in 3 weeks with Susa Simmonds, MD.   Dr. Nile Riggs: [phone]  Dr. Lita Mains: [phone]  Dr. Alto Denver: [phone]   If you find that you cannot contact your physician, but feel that your signs and   Symptoms warrant a physician's attention, call the Emergency Room at   [phone].   Othern/a.  FOLLOW UP  WITH DR. HAINES ON 10/15/2015 @ 10:15 AM

## 2015-09-24 ENCOUNTER — Encounter (HOSPITAL_COMMUNITY): Payer: Self-pay | Admitting: Ophthalmology

## 2015-09-26 ENCOUNTER — Other Ambulatory Visit: Payer: Self-pay | Admitting: Family Medicine

## 2015-09-26 DIAGNOSIS — Z1231 Encounter for screening mammogram for malignant neoplasm of breast: Secondary | ICD-10-CM

## 2015-10-02 ENCOUNTER — Other Ambulatory Visit: Payer: Self-pay | Admitting: Family Medicine

## 2015-10-02 ENCOUNTER — Ambulatory Visit (HOSPITAL_COMMUNITY)
Admission: RE | Admit: 2015-10-02 | Discharge: 2015-10-02 | Disposition: A | Discharge status: Home / Self Care | Payer: Medicare Other | Source: Ambulatory Visit | Attending: Family Medicine | Admitting: Family Medicine

## 2015-10-02 ENCOUNTER — Ambulatory Visit (HOSPITAL_COMMUNITY): Payer: Medicare Other

## 2015-10-02 DIAGNOSIS — Z1231 Encounter for screening mammogram for malignant neoplasm of breast: Secondary | ICD-10-CM | POA: Diagnosis present

## 2015-11-18 ENCOUNTER — Other Ambulatory Visit: Payer: Self-pay | Admitting: Family Medicine

## 2015-12-11 ENCOUNTER — Telehealth: Payer: Self-pay | Admitting: Family Medicine

## 2015-12-11 NOTE — Telephone Encounter (Signed)
Spoke with patient and informed her that refill on 90 day supply for Pravastatin was sent over to [facility] last month Patient verbalized understanding.

## 2015-12-11 NOTE — Telephone Encounter (Signed)
Pt is needing 90 day refills sent in on her   pravastatin (PRAVACHOL) 20 MG tablet         [facility] - [place], [place] - [address]

## 2015-12-30 ENCOUNTER — Other Ambulatory Visit: Payer: Self-pay

## 2015-12-30 MED ORDER — PRAVASTATIN SODIUM 20 MG PO TABS: ORAL_TABLET | ORAL | Status: DC

## 2016-04-01 ENCOUNTER — Other Ambulatory Visit: Payer: Self-pay | Admitting: *Deleted

## 2016-04-01 ENCOUNTER — Telehealth: Payer: Self-pay | Admitting: Family Medicine

## 2016-04-01 DIAGNOSIS — Z79899 Other long term (current) drug therapy: Secondary | ICD-10-CM

## 2016-04-01 DIAGNOSIS — E785 Hyperlipidemia, unspecified: Secondary | ICD-10-CM

## 2016-04-01 MED ORDER — PRAVASTATIN SODIUM 20 MG PO TABS: ORAL_TABLET | ORAL | 0 refills | Status: DC

## 2016-04-01 NOTE — Telephone Encounter (Signed)
statin (PRAVACHOL) 20 MG tablet   Refill, [facility] to [facility]

## 2016-04-01 NOTE — Telephone Encounter (Signed)
Discussed with pt. Med sent to pharm.  

## 2016-04-01 NOTE — Telephone Encounter (Signed)
Last lipid was nov 2016 was told at last visit in November to follow up in one year but repeat lipid in 2 -3 months. Pt did not repeat

## 2016-04-01 NOTE — Telephone Encounter (Signed)
May refill medicine 3 months, metabolic 7, lipid liver profile, follow-up office visit this fall

## 2016-05-20 ENCOUNTER — Other Ambulatory Visit: Payer: Self-pay | Admitting: Family Medicine

## 2016-05-20 NOTE — Telephone Encounter (Signed)
1 refill only needs office visit

## 2016-06-02 ENCOUNTER — Telehealth: Payer: Self-pay | Admitting: Family Medicine

## 2016-06-02 DIAGNOSIS — E785 Hyperlipidemia, unspecified: Secondary | ICD-10-CM

## 2016-06-02 DIAGNOSIS — Z79899 Other long term (current) drug therapy: Secondary | ICD-10-CM

## 2016-06-02 DIAGNOSIS — R5383 Other fatigue: Secondary | ICD-10-CM

## 2016-06-02 NOTE — Telephone Encounter (Signed)
Metabolic 7, lipid, liver, CBC-a short on aspirin plus also on cholesterol medicine

## 2016-06-02 NOTE — Telephone Encounter (Signed)
Requesting order for blood work for upcoming physical.

## 2016-06-03 NOTE — Telephone Encounter (Signed)
Notified patient that bloodwork has been ordered.  

## 2016-06-24 LAB — BASIC METABOLIC PANEL
BUN / CREAT RATIO: 17 (ref 12–28)
BUN: 15 mg/dL (ref 8–27)
CALCIUM: 9.5 mg/dL (ref 8.7–10.3)
CHLORIDE: 102 mmol/L (ref 96–106)
CO2: 27 mmol/L (ref 18–29)
CREATININE: 0.86 mg/dL (ref 0.57–1.00)
GFR calc Af Amer: 70 mL/min/{1.73_m2} (ref >59)
GFR calc non Af Amer: 61 mL/min/{1.73_m2} (ref >59)
GLUCOSE: 96 mg/dL (ref 65–99)
Potassium: 4.4 mmol/L (ref 3.5–5.2)
Sodium: 141 mmol/L (ref 134–144)

## 2016-06-24 LAB — CBC WITH DIFFERENTIAL/PLATELET
BASOS: 1 %
Basophils Absolute: 0.1 10*3/uL (ref 0.0–0.2)
EOS (ABSOLUTE): 0.4 10*3/uL (ref 0.0–0.4)
EOS: 5 %
HEMATOCRIT: 40 % (ref 34.0–46.6)
HEMOGLOBIN: 13.2 g/dL (ref 11.1–15.9)
Immature Grans (Abs): 0 10*3/uL (ref 0.0–0.1)
Immature Granulocytes: 0 %
LYMPHS ABS: 2.1 10*3/uL (ref 0.7–3.1)
Lymphs: 30 %
MCH: 31.7 pg (ref 26.6–33.0)
MCHC: 33 g/dL (ref 31.5–35.7)
MCV: 96 fL (ref 79–97)
MONOCYTES: 11 %
MONOS ABS: 0.8 10*3/uL (ref 0.1–0.9)
NEUTROS ABS: 3.7 10*3/uL (ref 1.4–7.0)
Neutrophils: 53 %
Platelets: 345 10*3/uL (ref 150–379)
RBC: 4.17 x10E6/uL (ref 3.77–5.28)
RDW: 13.6 % (ref 12.3–15.4)
WBC: 7.1 10*3/uL (ref 3.4–10.8)

## 2016-06-24 LAB — HEPATIC FUNCTION PANEL
ALK PHOS: 83 IU/L (ref 39–117)
ALT: 10 IU/L (ref 0–32)
AST: 19 IU/L (ref 0–40)
Albumin: 4.1 g/dL (ref 3.5–4.7)
BILIRUBIN TOTAL: 0.6 mg/dL (ref 0.0–1.2)
BILIRUBIN, DIRECT: 0.15 mg/dL (ref 0.00–0.40)
TOTAL PROTEIN: 6.4 g/dL (ref 6.0–8.5)

## 2016-06-24 LAB — LIPID PANEL
Chol/HDL Ratio: 3 ratio units (ref 0.0–4.4)
Cholesterol, Total: 191 mg/dL (ref 100–199)
HDL: 63 mg/dL (ref >39)
LDL Calculated: 103 mg/dL — ABNORMAL HIGH (ref 0–99)
TRIGLYCERIDES: 127 mg/dL (ref 0–149)
VLDL Cholesterol Cal: 25 mg/dL (ref 5–40)

## 2016-06-30 ENCOUNTER — Ambulatory Visit (INDEPENDENT_AMBULATORY_CARE_PROVIDER_SITE_OTHER): Payer: Medicare Other | Admitting: Family Medicine

## 2016-06-30 ENCOUNTER — Encounter: Payer: Self-pay | Admitting: Family Medicine

## 2016-06-30 VITALS — BP 120/72 | Ht 66.5 in | Wt 146.6 lb

## 2016-06-30 DIAGNOSIS — E785 Hyperlipidemia, unspecified: Secondary | ICD-10-CM

## 2016-06-30 DIAGNOSIS — Z0000 Encounter for general adult medical examination without abnormal findings: Secondary | ICD-10-CM

## 2016-06-30 DIAGNOSIS — E784 Other hyperlipidemia: Secondary | ICD-10-CM

## 2016-06-30 DIAGNOSIS — Z780 Asymptomatic menopausal state: Secondary | ICD-10-CM

## 2016-06-30 DIAGNOSIS — Z23 Encounter for immunization: Secondary | ICD-10-CM

## 2016-06-30 DIAGNOSIS — E7849 Other hyperlipidemia: Secondary | ICD-10-CM

## 2016-06-30 MED ORDER — PRAVASTATIN SODIUM 20 MG PO TABS: 20.0000 mg | ORAL_TABLET | Freq: Every day | ORAL | 3 refills | Status: DC

## 2016-06-30 NOTE — Progress Notes (Signed)
   Subjective:    Patient ID: Nancy GandyLillian L Cavell, female    DOB: 05/19/1927, 82 y.o.   MRN: [id number]  HPI The patient comes in today for a wellness visit.    A review of their health history was completed.  A review of medications was also completed.  Any needed refills; yes-pravachol  Eating habits: eating good  Falls/  MVA accidents in past few months: none  Regular exercise: housework  Specialist pt sees on regular basis: dr Lita Mainshaines for vision  Preventative health issues were discussed.   Additional concerns: needs flu shot    Review of Systems     Objective:   Physical Exam  Constitutional: She appears well-nourished. No distress.  Cardiovascular: Normal rate, regular rhythm and normal heart sounds.   No murmur heard. Pulmonary/Chest: Effort normal and breath sounds normal. No respiratory distress.  Abdominal: Soft. There is no tenderness.  Musculoskeletal: Normal range of motion. She exhibits no edema.  Lymphadenopathy:    She has no cervical adenopathy.  Neurological: She is alert. She exhibits normal muscle tone.  Psychiatric: Her behavior is normal.  Vitals reviewed.  Patient does take her cholesterol medicine she does watch her diet. She's tries to stay physically active.       Assessment & Plan:  Adult wellness-complete.wellness physical was conducted today. Importance of diet and exercise were discussed in detail. In addition to this a discussion regarding safety was also covered. We also reviewed over immunizations and gave recommendations regarding current immunization needed for age. In addition to this additional areas were also touched on including: Preventative health exams needed: Colonoscopy not indicated based on age  Patient was advised yearly wellness exam  Hyperlipidemia The patient was seen today as part of an evaluation regarding hyperlipidemia. Recent lab work has been reviewed with the patient as well as the goals for good cholesterol  care. In addition to this medications have been discussed the importance of compliance with diet and medications discussed as well. Patient has been informed of potential side effects of medications in the importance to notify us should any problems occur. Finally the patient is aware that poor control of cholesterol, noncompliance can dramatically increase her risk of heart attack strokes and premature death. The patient will keep regular office visits and the patient does agreed to periodic lab work.  Bone density recommended. Flu shot recommended

## 2016-07-13 ENCOUNTER — Ambulatory Visit (HOSPITAL_COMMUNITY)
Admission: RE | Admit: 2016-07-13 | Discharge: 2016-07-13 | Disposition: A | Discharge status: Home / Self Care | Payer: Medicare Other | Source: Ambulatory Visit | Attending: Family Medicine | Admitting: Family Medicine

## 2016-07-13 DIAGNOSIS — Z780 Asymptomatic menopausal state: Secondary | ICD-10-CM | POA: Insufficient documentation

## 2016-07-13 DIAGNOSIS — M8589 Other specified disorders of bone density and structure, multiple sites: Secondary | ICD-10-CM | POA: Insufficient documentation

## 2016-08-18 ENCOUNTER — Ambulatory Visit (INDEPENDENT_AMBULATORY_CARE_PROVIDER_SITE_OTHER): Payer: Medicare Other | Admitting: Family Medicine

## 2016-08-18 ENCOUNTER — Encounter: Payer: Self-pay | Admitting: Family Medicine

## 2016-08-18 VITALS — BP 122/82 | Ht 66.5 in | Wt 146.8 lb

## 2016-08-18 DIAGNOSIS — S20222A Contusion of left back wall of thorax, initial encounter: Secondary | ICD-10-CM | POA: Diagnosis not present

## 2016-08-18 DIAGNOSIS — M8588 Other specified disorders of bone density and structure, other site: Secondary | ICD-10-CM | POA: Insufficient documentation
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
## 2017-05-17 ENCOUNTER — Other Ambulatory Visit: Payer: Self-pay | Admitting: Family Medicine

## 2017-05-17 NOTE — Telephone Encounter (Signed)
This +2 refills needs office visit 

## 2017-05-17 NOTE — Telephone Encounter (Signed)
Last seen 06/30/16 for a chronic visit

## 2017-06-04 ENCOUNTER — Telehealth: Payer: Self-pay | Admitting: Family Medicine

## 2017-06-04 DIAGNOSIS — Z79899 Other long term (current) drug therapy: Secondary | ICD-10-CM

## 2017-06-04 DIAGNOSIS — E785 Hyperlipidemia, unspecified: Secondary | ICD-10-CM

## 2017-06-04 NOTE — Telephone Encounter (Signed)
Pt has wellness appt here 07/05/2017, pt states she may need to have lab work  Please advise & call pt when done

## 2017-06-04 NOTE — Telephone Encounter (Signed)
Patient last labs were drawn 06/23/2016 and had Cbc,Hepatic fx panel,Bmet,Lipid. Please advise.Thanks

## 2017-06-06 NOTE — Telephone Encounter (Signed)
CBC, lipid, liver, metabolic 7 °

## 2017-06-07 NOTE — Telephone Encounter (Signed)
bloodwork orders put in. Pt notified.  

## 2017-06-16 ENCOUNTER — Encounter: Payer: Self-pay | Admitting: Family Medicine

## 2017-06-16 LAB — BASIC METABOLIC PANEL
BUN/Creatinine Ratio: 17 (ref 12–28)
BUN: 15 mg/dL (ref 8–27)
CO2: 26 mmol/L (ref 20–29)
CREATININE: 0.86 mg/dL (ref 0.57–1.00)
Calcium: 9.3 mg/dL (ref 8.7–10.3)
Chloride: 104 mmol/L (ref 96–106)
GFR calc Af Amer: 70 mL/min/{1.73_m2} (ref >59)
GFR calc non Af Amer: 61 mL/min/{1.73_m2} (ref >59)
GLUCOSE: 88 mg/dL (ref 65–99)
POTASSIUM: 4.4 mmol/L (ref 3.5–5.2)
SODIUM: 143 mmol/L (ref 134–144)

## 2017-06-16 LAB — HEPATIC FUNCTION PANEL
ALK PHOS: 76 IU/L (ref 39–117)
ALT: 13 IU/L (ref 0–32)
AST: 19 IU/L (ref 0–40)
Albumin: 3.9 g/dL (ref 3.5–4.7)
Bilirubin Total: 0.4 mg/dL (ref 0.0–1.2)
Bilirubin, Direct: 0.12 mg/dL (ref 0.00–0.40)
TOTAL PROTEIN: 6.2 g/dL (ref 6.0–8.5)

## 2017-06-16 LAB — CBC WITH DIFFERENTIAL/PLATELET
Basophils Absolute: 0.1 10*3/uL (ref 0.0–0.2)
Basos: 1 %
EOS (ABSOLUTE): 0.3 10*3/uL (ref 0.0–0.4)
Eos: 5 %
Hematocrit: 38.7 % (ref 34.0–46.6)
Hemoglobin: 12.6 g/dL (ref 11.1–15.9)
IMMATURE GRANULOCYTES: 0 %
Immature Grans (Abs): 0 10*3/uL (ref 0.0–0.1)
Lymphocytes Absolute: 2 10*3/uL (ref 0.7–3.1)
Lymphs: 33 %
MCH: 30.7 pg (ref 26.6–33.0)
MCHC: 32.6 g/dL (ref 31.5–35.7)
MCV: 94 fL (ref 79–97)
MONOS ABS: 0.7 10*3/uL (ref 0.1–0.9)
Monocytes: 12 %
NEUTROS PCT: 49 %
Neutrophils Absolute: 3 10*3/uL (ref 1.4–7.0)
Platelets: 298 10*3/uL (ref 150–379)
RBC: 4.1 x10E6/uL (ref 3.77–5.28)
RDW: 14 % (ref 12.3–15.4)
WBC: 6.1 10*3/uL (ref 3.4–10.8)

## 2017-06-16 LAB — LIPID PANEL
Chol/HDL Ratio: 3.2 ratio (ref 0.0–4.4)
Cholesterol, Total: 181 mg/dL (ref 100–199)
HDL: 56 mg/dL (ref >39)
LDL CALC: 98 mg/dL (ref 0–99)
Triglycerides: 135 mg/dL (ref 0–149)
VLDL CHOLESTEROL CAL: 27 mg/dL (ref 5–40)

## 2017-07-05 ENCOUNTER — Encounter: Payer: Self-pay | Admitting: Family Medicine

## 2017-07-05 ENCOUNTER — Ambulatory Visit: Payer: Medicare Other | Admitting: Family Medicine

## 2017-07-05 VITALS — BP 122/78 | Ht 66.5 in | Wt 143.1 lb

## 2017-07-05 DIAGNOSIS — Z0000 Encounter for general adult medical examination without abnormal findings: Secondary | ICD-10-CM | POA: Diagnosis not present

## 2017-07-05 DIAGNOSIS — Z23 Encounter for immunization: Secondary | ICD-10-CM
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
## 2017-07-12 ENCOUNTER — Encounter: Payer: Self-pay | Admitting: Family Medicine

## 2017-07-12 ENCOUNTER — Ambulatory Visit (INDEPENDENT_AMBULATORY_CARE_PROVIDER_SITE_OTHER): Payer: Medicare Other | Admitting: Family Medicine

## 2017-07-12 VITALS — BP 114/76 | Ht 66.5 in | Wt 139.4 lb

## 2017-07-12 DIAGNOSIS — M549 Dorsalgia, unspecified: Secondary | ICD-10-CM

## 2017-07-12 MED ORDER — CHLORZOXAZONE 500 MG PO TABS: ORAL_TABLET | ORAL | 0 refills | Status: DC

## 2017-07-12 MED ORDER — DICLOFENAC SODIUM 75 MG PO TBEC: 75.0000 mg | DELAYED_RELEASE_TABLET | Freq: Two times a day (BID) | ORAL | 0 refills | Status: DC

## 2017-07-12 NOTE — Progress Notes (Signed)
   Subjective:    Patient ID: Nancy Kirby, female    DOB: 04-Sep-1926, 83 y.o.   MRN: [id number]  Back Pain  This is a new problem. The current episode started in the past 7 days. The pain is present in the thoracic spine. The symptoms are aggravated by lying down and sitting. Stiffness is present all day. She has tried NSAIDs for the symptoms.  Patient relates a lot of back pain discomfort on the right side of her back radiates around to the right side of the ribs no abdominal symptoms no nausea with it no reflux no fever chills sweats or dysuria denies hematuria  Patient states no other concerns this visit.  Review of Systems  Musculoskeletal: Positive for back pain.  Relates mid thoracic back pain     Objective:   Physical Exam  Lungs clear heart regular abdomen soft flank nontender Spine nontender some tenderness to the right rib region no skin lesions seen some slight tenderness with rotation     Assessment & Plan:  More than likely musculoskeletal but I cannot rule out the possibility of this being shingles I doubt that this is gallbladder disease or cancer if does not respond to anti-inflammatory and muscle relaxer over the next week x-rays lab work would be indicated

## 2017-07-12 NOTE — Patient Instructions (Signed)
Most likely muscle strain  Look at area daily to see if shingles rash occurs  Try meds over the next week  Call us Friday a.m. For update sooner if worse  Will  need testing if symptoms worsen

## 2017-07-16 ENCOUNTER — Telehealth: Payer: Self-pay | Admitting: Family Medicine

## 2017-07-16 NOTE — Telephone Encounter (Signed)
Pt calling to give update - states she feels much better & is 99%well

## 2017-07-16 NOTE — Telephone Encounter (Signed)
We are glad she is much better

## 2017-08-25 ENCOUNTER — Telehealth: Payer: Self-pay | Admitting: Family Medicine

## 2017-08-25 MED ORDER — PRAVASTATIN SODIUM 20 MG PO TABS: 20.0000 mg | ORAL_TABLET | Freq: Every day | ORAL | 1 refills | Status: DC

## 2017-08-25 NOTE — Telephone Encounter (Signed)
Patient is aware we have sent the rx.

## 2017-08-25 NOTE — Telephone Encounter (Signed)
Patient is requesting refill on pravastatin 20 mg needing a new prescription to be sent over to [facility] 90 day supply with refills.

## 2018-01-20 ENCOUNTER — Telehealth: Payer: Self-pay | Admitting: *Deleted

## 2018-01-20 NOTE — Telephone Encounter (Signed)
No record of tetanus in Epic.

## 2018-01-20 NOTE — Telephone Encounter (Signed)
Patient called stating she feels like she is due for a tetanus shot but can't remember when her last one was. Please advise if patient can be scheduled

## 2018-01-21 ENCOUNTER — Ambulatory Visit: Payer: Medicare Other | Admitting: Family Medicine

## 2018-01-21 ENCOUNTER — Encounter: Payer: Self-pay | Admitting: Family Medicine

## 2018-01-21 VITALS — BP 102/74 | Temp 98.3°F | Ht 66.5 in | Wt 145.0 lb

## 2018-01-21 DIAGNOSIS — S61401A Unspecified open wound of right hand, initial encounter: Secondary | ICD-10-CM

## 2018-01-21 DIAGNOSIS — M79641 Pain in right hand: Secondary | ICD-10-CM | POA: Diagnosis not present

## 2018-01-21 DIAGNOSIS — W540XXA Bitten by dog, initial encounter: Secondary | ICD-10-CM | POA: Diagnosis not present

## 2018-01-21 DIAGNOSIS — Z23 Encounter for immunization: Secondary | ICD-10-CM

## 2018-01-21 MED ORDER — AMOXICILLIN-POT CLAVULANATE 875-125 MG PO TABS: 1.0000 | ORAL_TABLET | Freq: Two times a day (BID) | ORAL | 0 refills | Status: DC

## 2018-01-21 NOTE — Telephone Encounter (Signed)
The patient can get tetanus shot here or health department some plans will cover a tetanus shot other plans do not cover tetanus shots only if there is a scratch cut etc.  The patient can always talk with her plan to find out if it is covered

## 2018-01-21 NOTE — Progress Notes (Signed)
   Subjective:    Patient ID: Nancy Kirby, female    DOB: Mar 18, 1927, 84 y.o.   MRN: [id number]  HPI Patient is here today for a dog bite. She had to put her yorkie down, her son took her by the dog shelter and the dog there bit her. She has been using saline oaks and antibiotic cream. She relates she recently lost her dog Therefore she got a new dog But this dog had a bad temperament And it bit her Unfortunately she now comes in today for evaluation plus also the probability of having to be on antibiotics and a tetanus shot  Review of Systems She denies any wrist pain forearm pain chest pain fevers chills sweats.    Objective:   Physical Exam  Lungs clear respiratory rate normal heart regular hand does not appear to have any infection but there is bite to her finger that has broken the skin on both sides      Assessment & Plan:  Dog bite Tetanus shot given today No sign of infection Dog is up-to-date on shots including rabies Warning signs regarding severe infection discussed Follow-up if problems Warm compresses frequently

## 2018-01-21 NOTE — Telephone Encounter (Signed)
Patient stated she will go to a local pharmacy and get an update on tetanus.

## 2018-02-23 ENCOUNTER — Telehealth: Payer: Self-pay | Admitting: Family Medicine

## 2018-02-23 MED ORDER — PRAVASTATIN SODIUM 20 MG PO TABS: 20.0000 mg | ORAL_TABLET | Freq: Every day | ORAL | 1 refills | Status: DC

## 2018-02-23 NOTE — Telephone Encounter (Signed)
Patient is requesting refill on Pravastatin 20 mg 90 day supply to [facility].

## 2018-02-23 NOTE — Telephone Encounter (Signed)
Patient is aware 

## 2018-04-01 ENCOUNTER — Telehealth: Payer: Self-pay | Admitting: Family Medicine

## 2018-04-01 DIAGNOSIS — Z0000 Encounter for general adult medical examination without abnormal findings: Secondary | ICD-10-CM

## 2018-04-01 DIAGNOSIS — Z79899 Other long term (current) drug therapy: Secondary | ICD-10-CM

## 2018-04-01 DIAGNOSIS — E7849 Other hyperlipidemia: Secondary | ICD-10-CM

## 2018-04-01 DIAGNOSIS — R5383 Other fatigue: Secondary | ICD-10-CM

## 2018-04-01 NOTE — Telephone Encounter (Signed)
Lipid, liver, metabolic 7, CBC 

## 2018-04-01 NOTE — Telephone Encounter (Signed)
Last labs 06/15/17 lipid, liver, bmp, cbc

## 2018-04-01 NOTE — Telephone Encounter (Signed)
Pt has CPE scheduled for 11/13. She would like to get her labs done about a week before that appt.

## 2018-04-04 NOTE — Telephone Encounter (Signed)
Patient is aware the labs have been ordered.

## 2018-04-04 NOTE — Addendum Note (Signed)
Addended by: Meredith LeedsSUTTON, Chennel Olivos L on: 04/04/2018 08:35 AM   Modules accepted: Orders

## 2018-04-04 NOTE — Addendum Note (Signed)
Addended by: Meredith LeedsSUTTON, CRYSTAL L on: 04/04/2018 08:36 AM   Modules accepted: Orders

## 2018-06-22 LAB — BASIC METABOLIC PANEL
BUN / CREAT RATIO: 18 (ref 12–28)
BUN: 16 mg/dL (ref 8–27)
CHLORIDE: 103 mmol/L (ref 96–106)
CO2: 26 mmol/L (ref 20–29)
Calcium: 9.1 mg/dL (ref 8.7–10.3)
Creatinine, Ser: 0.9 mg/dL (ref 0.57–1.00)
GFR calc non Af Amer: 57 mL/min/{1.73_m2} — ABNORMAL LOW (ref >59)
GFR, EST AFRICAN AMERICAN: 66 mL/min/{1.73_m2} (ref >59)
GLUCOSE: 97 mg/dL (ref 65–99)
POTASSIUM: 4.4 mmol/L (ref 3.5–5.2)
SODIUM: 142 mmol/L (ref 134–144)

## 2018-06-22 LAB — HEPATIC FUNCTION PANEL
ALK PHOS: 84 IU/L (ref 39–117)
ALT: 11 IU/L (ref 0–32)
AST: 18 IU/L (ref 0–40)
Albumin: 4 g/dL (ref 3.5–4.7)
BILIRUBIN TOTAL: 0.5 mg/dL (ref 0.0–1.2)
BILIRUBIN, DIRECT: 0.12 mg/dL (ref 0.00–0.40)
Total Protein: 6.2 g/dL (ref 6.0–8.5)

## 2018-06-22 LAB — CBC WITH DIFFERENTIAL/PLATELET
BASOS: 1 %
Basophils Absolute: 0.1 10*3/uL (ref 0.0–0.2)
EOS (ABSOLUTE): 0.3 10*3/uL (ref 0.0–0.4)
Eos: 5 %
HEMATOCRIT: 37.4 % (ref 34.0–46.6)
Hemoglobin: 12.6 g/dL (ref 11.1–15.9)
Immature Grans (Abs): 0 10*3/uL (ref 0.0–0.1)
Immature Granulocytes: 0 %
LYMPHS ABS: 2.1 10*3/uL (ref 0.7–3.1)
Lymphs: 28 %
MCH: 31.9 pg (ref 26.6–33.0)
MCHC: 33.7 g/dL (ref 31.5–35.7)
MCV: 95 fL (ref 79–97)
MONOS ABS: 0.7 10*3/uL (ref 0.1–0.9)
Monocytes: 9 %
NEUTROS ABS: 4.1 10*3/uL (ref 1.4–7.0)
Neutrophils: 57 %
PLATELETS: 401 10*3/uL (ref 150–450)
RBC: 3.95 x10E6/uL (ref 3.77–5.28)
RDW: 13.2 % (ref 12.3–15.4)
WBC: 7.3 10*3/uL (ref 3.4–10.8)

## 2018-06-22 LAB — LIPID PANEL
CHOLESTEROL TOTAL: 194 mg/dL (ref 100–199)
Chol/HDL Ratio: 3.3 ratio (ref 0.0–4.4)
HDL: 58 mg/dL (ref >39)
LDL Calculated: 115 mg/dL — ABNORMAL HIGH (ref 0–99)
TRIGLYCERIDES: 107 mg/dL (ref 0–149)
VLDL Cholesterol Cal: 21 mg/dL (ref 5–40)

## 2018-07-06 ENCOUNTER — Encounter: Payer: Self-pay | Admitting: Family Medicine

## 2018-07-06 ENCOUNTER — Ambulatory Visit (INDEPENDENT_AMBULATORY_CARE_PROVIDER_SITE_OTHER): Payer: Medicare Other | Admitting: Family Medicine

## 2018-07-06 VITALS — BP 118/78 | Ht 66.5 in | Wt 149.0 lb

## 2018-07-06 DIAGNOSIS — Z0000 Encounter for general adult medical examination without abnormal findings: Secondary | ICD-10-CM | POA: Diagnosis not present

## 2018-07-06 DIAGNOSIS — E7849 Other hyperlipidemia: Secondary | ICD-10-CM

## 2018-07-06 DIAGNOSIS — Z23 Encounter for immunization: Secondary | ICD-10-CM | POA: Diagnosis not present

## 2018-07-06 MED ORDER — PRAVASTATIN SODIUM 40 MG PO TABS: 40.0000 mg | ORAL_TABLET | Freq: Every day | ORAL | 1 refills | Status: DC

## 2018-07-06 NOTE — Progress Notes (Signed)
   Subjective:    Patient ID: Nancy GandyLillian L Amsler, female    DOB: October 27, 1926, 84 y.o.   MRN: [id number]  HPI AWV- Annual Wellness Visit  The patient was seen for their annual wellness visit. The patient's past medical history, surgical history, and family history were reviewed. Pertinent vaccines were reviewed ( tetanus, pneumonia, shingles, flu) The patient's medication list was reviewed and updated.  The height and weight were entered.  BMI recorded in electronic record elsewhere  Cognitive screening was completed. Outcome of Mini - Cog: Passed   Falls /depression screening electronically recorded within record elsewhere None  Current tobacco usage:None (All patients who use tobacco were given written and verbal information on quitting)  Recent listing of emergency department/hospitalizations over the past year were reviewed.  current specialist the patient sees on a regular basis: None   Medicare annual wellness visit patient questionnaire was reviewed.  A written screening schedule for the patient for the next 5-10 years was given. Appropriate discussion of followup regarding next visit was discussed.      Review of Systems  Constitutional: Negative for activity change, appetite change and fatigue.  HENT: Negative for congestion and rhinorrhea.   Respiratory: Negative for cough and shortness of breath.   Cardiovascular: Negative for chest pain and leg swelling.  Gastrointestinal: Negative for abdominal pain and diarrhea.  Endocrine: Negative for polydipsia and polyphagia.  Skin: Negative for color change.  Neurological: Negative for dizziness and weakness.  Psychiatric/Behavioral: Negative for behavioral problems and confusion.       Objective:   Physical Exam  Constitutional: She appears well-nourished. No distress.  HENT:  Head: Normocephalic and atraumatic.  Eyes: Right eye exhibits no discharge. Left eye exhibits no discharge.  Neck: No tracheal deviation  present.  Cardiovascular: Normal rate, regular rhythm and normal heart sounds.  No murmur heard. Pulmonary/Chest: Effort normal and breath sounds normal. No respiratory distress.  Musculoskeletal: She exhibits no edema.  Lymphadenopathy:    She has no cervical adenopathy.  Neurological: She is alert. Coordination normal.  Skin: Skin is warm and dry.  Psychiatric: She has a normal mood and affect. Her behavior is normal.  Vitals reviewed.   Patient defers pelvic exam defers breast exam      Assessment & Plan:  Adult wellness-complete.wellness physical was conducted today. Importance of diet and exercise were discussed in detail.  In addition to this a discussion regarding safety was also covered. We also reviewed over immunizations and gave recommendations regarding current immunization needed for age.  In addition to this additional areas were also touched on including: Preventative health exams needed:  Colonoscopy not indicated because of age  Patient was advised yearly wellness exam

## 2018-07-08 ENCOUNTER — Encounter: Payer: Self-pay | Admitting: Family Medicine

## 2018-07-08 ENCOUNTER — Ambulatory Visit: Payer: Medicare Other | Admitting: Family Medicine

## 2018-07-08 VITALS — BP 132/88 | Temp 97.7°F | Ht 66.5 in

## 2018-07-08 DIAGNOSIS — M25511 Pain in right shoulder: Secondary | ICD-10-CM

## 2018-07-08 MED ORDER — HYDROCODONE-ACETAMINOPHEN 5-325 MG PO TABS: ORAL_TABLET | ORAL | 0 refills | Status: DC

## 2018-07-08 MED ORDER — DICLOFENAC SODIUM 75 MG PO TBEC: DELAYED_RELEASE_TABLET | ORAL | 0 refills | Status: DC

## 2018-07-08 NOTE — Progress Notes (Signed)
   Subjective:    Patient ID: Nancy Kirby, female    DOB: Nov 10, 1926, 84 y.o.   MRN: [id number]  Allergic Reaction  This is a new problem. The current episode started 2 days ago. Associated with: PT was given the flu shot on Wednesday. (Right shoulder blade began to "knot up" and feels like a spasms. Pain is radiation to right side.pt has not slept in 2 nights. Nausea.) There is no swelling present. Treatments tried: Aleve. The treatment provided mild relief.   Pt felt a tightness in the upper shoulder  Feels a bad spasm and pain for two days  No hx in the past to rxns iflu in the past  Trouble with pain and ddiscomfort along the shoulder   otc took aleave one as needed not helping much   Mild nausea with the first night      Review of Systems No headache, no major weight loss or weight gain, no chest pain no back pain abdominal pain no change in bowel habits complete ROS otherwise negative     Objective:   Physical Exam Alert vitals stable, NAD. Blood pressure good on repeat. HEENT normal. Lungs clear. Heart regular rate and rhythm. Posterior right shoulder tenderness to deep palpation.  Minimal tenderness and no warmth and no obvious swelling at injection site  Impression shoulder pain status post flu shot plan anti-inflammatory medicine prescribed and pain medicine.  Symptom care discussed local measures discussed expect gradual resolution       Assessment & Plan:

## 2018-11-23 ENCOUNTER — Telehealth: Payer: Self-pay | Admitting: Family Medicine

## 2018-11-23 ENCOUNTER — Other Ambulatory Visit: Payer: Self-pay | Admitting: Family Medicine

## 2018-11-23 MED ORDER — OXYBUTYNIN CHLORIDE ER 5 MG PO TB24: 5.0000 mg | ORAL_TABLET | Freq: Every day | ORAL | 0 refills | Status: DC

## 2018-11-23 NOTE — Telephone Encounter (Signed)
I discussed the case with the patient Having frequency of urination in the evening time More than likely related to overactive bladder  Denies dysuria denies fever chills I would recommend trying Ditropan XL generic 5 mg nightly she will try this for the next 2 weeks and give [place] update on how that is doing I told the patient if it causes drowsiness or dizziness for excessive dry mouth to stop the medicine

## 2018-11-23 NOTE — Telephone Encounter (Signed)
Patient is having to get up to go to the bathroom several times a night and wanted to know if there is anything she can do or take to help.  I told the patient that this could turn into a phone visit.  Patient does not have video capability.

## 2019-02-16 ENCOUNTER — Other Ambulatory Visit: Payer: Self-pay | Admitting: Family Medicine

## 2019-03-03 ENCOUNTER — Other Ambulatory Visit: Payer: Self-pay

## 2019-03-03 ENCOUNTER — Ambulatory Visit (INDEPENDENT_AMBULATORY_CARE_PROVIDER_SITE_OTHER): Payer: Medicare Other | Admitting: Family Medicine

## 2019-03-03 DIAGNOSIS — M25552 Pain in left hip: Secondary | ICD-10-CM | POA: Diagnosis not present

## 2019-03-03 NOTE — Progress Notes (Signed)
   Subjective:  Audio only  Patient ID: Nancy Kirby, female    DOB: [birth year], 85 y.o.   MRN: [id number]  HPI  Patient calls with left hip pain for a few weeks since a fall. Patient states it is a certain pressure poin she can put her finger on.  Virtual Visit via Video Note  I connected with Stephinie Battisti Schneller on 03/03/19 at 10:00 AM EDT by a video enabled telemedicine application and verified that I am speaking with the correct person using two identifiers.  Location: Patient: home Provider: office   I discussed the limitations of evaluation and management by telemedicine and the availability of in person appointments. The patient expressed understanding and agreed to proceed.  History of Present Illness:    Observations/Objective:   Assessment and Plan:   Follow Up Instructions:    I discussed the assessment and treatment plan with the patient. The patient was provided an opportunity to ask questions and all were answered. The patient agreed with the plan and demonstrated an understanding of the instructions.   The patient was advised to call back or seek an in-person evaluation if the symptoms worsen or if the condition fails to improve as anticipated.  I provided 74minutes of non-face-to-face time during this encounter.  Two yrs ago took fall n injured hip  Had a persistent bruise n swelling  Pt last night turned over on pressure point  develop      Review of Systems No headache, no major weight loss or weight gain, no chest pain no back pain abdominal pain no change in bowel habits complete ROS otherwise negative     Objective:   Physical Exam   Virtual     Assessment & Plan:  Impression transient hip pain.  At site of prior bursitis.  Remote injury 2 years ago.  Had a significant hematoma than this has not recurred.  Overall pain, significantly better today.  No diagnostic this time.  Rationale discussed

## 2019-03-04 ENCOUNTER — Encounter: Payer: Self-pay | Admitting: Family Medicine

## 2019-04-20 ENCOUNTER — Telehealth: Payer: Self-pay | Admitting: Family Medicine

## 2019-04-20 DIAGNOSIS — E7849 Other hyperlipidemia: Secondary | ICD-10-CM

## 2019-04-20 DIAGNOSIS — R5383 Other fatigue: Secondary | ICD-10-CM

## 2019-04-20 DIAGNOSIS — Z79899 Other long term (current) drug therapy: Secondary | ICD-10-CM

## 2019-04-20 NOTE — Telephone Encounter (Signed)
Lipid, liver, metabolic 7, CBC 

## 2019-04-20 NOTE — Telephone Encounter (Signed)
Blood work ordered in Epic. 

## 2019-04-20 NOTE — Telephone Encounter (Signed)
Last labs 06/21/2018: Lipid, Liver, Met 7 and CBC 

## 2019-04-20 NOTE — Telephone Encounter (Signed)
Patient scheduled her physical for November and would like orders put in to have labs done at [facility].  No need for return call.

## 2019-05-12 ENCOUNTER — Other Ambulatory Visit: Payer: Self-pay | Admitting: Family Medicine

## 2019-05-15 NOTE — Telephone Encounter (Signed)
May have 90-day refill Patient can do virtual follow-up for this fall Given her age we recommend virtual

## 2019-05-15 NOTE — Telephone Encounter (Signed)
Please schedule and then route back to nurses 

## 2019-05-17 NOTE — Telephone Encounter (Signed)
Patient states cancel prescription has enough medication until physical appointment in November.

## 2019-05-17 NOTE — Telephone Encounter (Signed)
Left message to schedule appointment

## 2019-05-28 ENCOUNTER — Other Ambulatory Visit: Payer: Self-pay | Admitting: Family Medicine

## 2019-06-21 LAB — HEPATIC FUNCTION PANEL
ALT: 11 IU/L (ref 0–32)
AST: 20 IU/L (ref 0–40)
Albumin: 4.1 g/dL (ref 3.5–4.6)
Alkaline Phosphatase: 95 IU/L (ref 39–117)
Bilirubin Total: 0.5 mg/dL (ref 0.0–1.2)
Bilirubin, Direct: 0.14 mg/dL (ref 0.00–0.40)
Total Protein: 6.2 g/dL (ref 6.0–8.5)

## 2019-06-21 LAB — CBC WITH DIFFERENTIAL/PLATELET
Basophils Absolute: 0.1 10*3/uL (ref 0.0–0.2)
Basos: 1 %
EOS (ABSOLUTE): 0.4 10*3/uL (ref 0.0–0.4)
Eos: 5 %
Hematocrit: 37.2 % (ref 34.0–46.6)
Hemoglobin: 12.2 g/dL (ref 11.1–15.9)
Immature Grans (Abs): 0 10*3/uL (ref 0.0–0.1)
Immature Granulocytes: 0 %
Lymphocytes Absolute: 2.1 10*3/uL (ref 0.7–3.1)
Lymphs: 26 %
MCH: 31.6 pg (ref 26.6–33.0)
MCHC: 32.8 g/dL (ref 31.5–35.7)
MCV: 96 fL (ref 79–97)
Monocytes Absolute: 0.8 10*3/uL (ref 0.1–0.9)
Monocytes: 9 %
Neutrophils Absolute: 4.7 10*3/uL (ref 1.4–7.0)
Neutrophils: 59 %
Platelets: 371 10*3/uL (ref 150–450)
RBC: 3.86 x10E6/uL (ref 3.77–5.28)
RDW: 12.8 % (ref 11.7–15.4)
WBC: 8 10*3/uL (ref 3.4–10.8)

## 2019-06-21 LAB — BASIC METABOLIC PANEL
BUN/Creatinine Ratio: 18 (ref 12–28)
BUN: 17 mg/dL (ref 10–36)
CO2: 27 mmol/L (ref 20–29)
Calcium: 9.3 mg/dL (ref 8.7–10.3)
Chloride: 103 mmol/L (ref 96–106)
Creatinine, Ser: 0.95 mg/dL (ref 0.57–1.00)
GFR calc Af Amer: 61 mL/min/{1.73_m2} (ref >59)
GFR calc non Af Amer: 53 mL/min/{1.73_m2} — ABNORMAL LOW (ref >59)
Glucose: 93 mg/dL (ref 65–99)
Potassium: 4.2 mmol/L (ref 3.5–5.2)
Sodium: 141 mmol/L (ref 134–144)

## 2019-06-21 LAB — LIPID PANEL
Chol/HDL Ratio: 3.1 ratio (ref 0.0–4.4)
Cholesterol, Total: 182 mg/dL (ref 100–199)
HDL: 59 mg/dL (ref >39)
LDL Chol Calc (NIH): 100 mg/dL — ABNORMAL HIGH (ref 0–99)
Triglycerides: 129 mg/dL (ref 0–149)
VLDL Cholesterol Cal: 23 mg/dL (ref 5–40)

## 2019-07-11 ENCOUNTER — Encounter: Payer: Self-pay | Admitting: Family Medicine

## 2019-07-11 ENCOUNTER — Ambulatory Visit (INDEPENDENT_AMBULATORY_CARE_PROVIDER_SITE_OTHER): Payer: Medicare Other | Admitting: Family Medicine

## 2019-07-11 ENCOUNTER — Other Ambulatory Visit: Payer: Self-pay

## 2019-07-11 VITALS — BP 106/74 | Temp 97.1°F | Ht 66.5 in | Wt 149.0 lb

## 2019-07-11 DIAGNOSIS — Z0000 Encounter for general adult medical examination without abnormal findings: Secondary | ICD-10-CM

## 2019-07-11 DIAGNOSIS — Z23 Encounter for immunization: Secondary | ICD-10-CM

## 2019-07-11 MED ORDER — ZOSTER VAC RECOMB ADJUVANTED 50 MCG/0.5ML IM SUSR: 0.5000 mL | Freq: Once | INTRAMUSCULAR | 1 refills | Status: AC

## 2019-07-11 NOTE — Progress Notes (Signed)
Subjective:    Patient ID: Nancy Kirby, female    DOB: 12/05/1926, 85 y.o.   MRN: [id number]  HPI AWV- Annual Wellness Visit  The patient was seen for their annual wellness visit. The patient's past medical history, surgical history, and family history were reviewed. Pertinent vaccines were reviewed ( tetanus, pneumonia, shingles, flu) The patient's medication list was reviewed and updated.  The height and weight were entered.  BMI recorded in electronic record elsewhere  Cognitive screening was completed. Outcome of Mini - Cog: pass   Falls /depression screening electronically recorded within record elsewhere  Current tobacco usage: none (All patients who use tobacco were given written and verbal information on quitting)  Recent listing of emergency department/hospitalizations over the past year were reviewed.  current specialist the patient sees on a regular basis: none   Medicare annual wellness visit patient questionnaire was reviewed.  A written screening schedule for the patient for the next 5-10 years was given. Appropriate discussion of followup regarding next visit was discussed.  Concerns about getting flu vaccine due to her having side effects last year. Last year she had spasms for 24 hours after flu vaccine.   Concerns about her son coming in from [place] and has some concerns. He is taking an auto immune medication and pt has concerns about if he should quarantine before coming to her house and if he they should wear a mask when he comes to her house.   Would like a necessary list from doctor for otc meds.        Review of Systems  Constitutional: Negative for activity change, appetite change and fatigue.  HENT: Negative for congestion and rhinorrhea.   Respiratory: Negative for cough and shortness of breath.   Cardiovascular: Negative for chest pain and leg swelling.  Gastrointestinal: Negative for abdominal pain and diarrhea.  Endocrine: Negative  for polydipsia and polyphagia.  Skin: Negative for color change.  Neurological: Negative for dizziness and weakness.  Psychiatric/Behavioral: Negative for behavioral problems and confusion.       Objective:   Physical Exam Vitals signs reviewed.  Constitutional:      General: She is not in acute distress. HENT:     Head: Normocephalic and atraumatic.  Eyes:     General:        Right eye: No discharge.        Left eye: No discharge.  Neck:     Trachea: No tracheal deviation.  Cardiovascular:     Rate and Rhythm: Normal rate and regular rhythm.     Heart sounds: Normal heart sounds. No murmur.  Pulmonary:     Effort: Pulmonary effort is normal. No respiratory distress.     Breath sounds: Normal breath sounds.  Lymphadenopathy:     Cervical: No cervical adenopathy.  Skin:    General: Skin is warm and dry.  Neurological:     Mental Status: She is alert.     Coordination: Coordination normal.  Psychiatric:        Behavior: Behavior normal.           Assessment & Plan:  A letter was given for all of her OTC meds to help get it covered  Adult wellness-complete.wellness physical was conducted today. Importance of diet and exercise were discussed in detail.  In addition to this a discussion regarding safety was also covered. We also reviewed over immunizations and gave recommendations regarding current immunization needed for age.  In addition to this additional areas  were also touched on including: Preventative health exams needed:  Colonoscopy colonoscopy not indicated Shingrix indicated prescription given Patient no longer does mammograms Fall evaluation negative Refills given regarding her cholesterol medicine Lab work was reviewed with patient Follow-up in 1 year or sooner problems  Patient was advised yearly wellness exam

## 2019-07-11 NOTE — Patient Instructions (Signed)
Results for orders placed or performed in visit on 04/20/19  Lipid panel  Result Value Ref Range   Cholesterol, Total 182 100 - 199 mg/dL   Triglycerides 129 0 - 149 mg/dL   HDL 59 >39 mg/dL   VLDL Cholesterol Cal 23 5 - 40 mg/dL   LDL Chol Calc (NIH) 100 (H) 0 - 99 mg/dL   Chol/HDL Ratio 3.1 0.0 - 4.4 ratio  Hepatic function panel  Result Value Ref Range   Total Protein 6.2 6.0 - 8.5 g/dL   Albumin 4.1 3.5 - 4.6 g/dL   Bilirubin Total 0.5 0.0 - 1.2 mg/dL   Bilirubin, Direct 0.14 0.00 - 0.40 mg/dL   Alkaline Phosphatase 95 39 - 117 IU/L   AST 20 0 - 40 IU/L   ALT 11 0 - 32 IU/L  Basic metabolic panel  Result Value Ref Range   Glucose 93 65 - 99 mg/dL   BUN 17 10 - 36 mg/dL   Creatinine, Ser 0.95 0.57 - 1.00 mg/dL   GFR calc non Af Amer 53 (L) >59 mL/min/1.73   GFR calc Af Amer 61 >59 mL/min/1.73   BUN/Creatinine Ratio 18 12 - 28   Sodium 141 134 - 144 mmol/L   Potassium 4.2 3.5 - 5.2 mmol/L   Chloride 103 96 - 106 mmol/L   CO2 27 20 - 29 mmol/L   Calcium 9.3 8.7 - 10.3 mg/dL  CBC with Differential/Platelet  Result Value Ref Range   WBC 8.0 3.4 - 10.8 x10E3/uL   RBC 3.86 3.77 - 5.28 x10E6/uL   Hemoglobin 12.2 11.1 - 15.9 g/dL   Hematocrit 37.2 34.0 - 46.6 %   MCV 96 79 - 97 fL   MCH 31.6 26.6 - 33.0 pg   MCHC 32.8 31.5 - 35.7 g/dL   RDW 12.8 11.7 - 15.4 %   Platelets 371 150 - 450 x10E3/uL   Neutrophils 59 Not Estab. %   Lymphs 26 Not Estab. %   Monocytes 9 Not Estab. %   Eos 5 Not Estab. %   Basos 1 Not Estab. %   Neutrophils Absolute 4.7 1.4 - 7.0 x10E3/uL   Lymphocytes Absolute 2.1 0.7 - 3.1 x10E3/uL   Monocytes Absolute 0.8 0.1 - 0.9 x10E3/uL   EOS (ABSOLUTE) 0.4 0.0 - 0.4 x10E3/uL   Basophils Absolute 0.1 0.0 - 0.2 x10E3/uL   Immature Granulocytes 0 Not Estab. %   Immature Grans (Abs) 0.0 0.0 - 0.1 x10E3/uL     Shingrix and shingles prevention: know the facts!   Shingrix is a very effective vaccine to prevent shingles.   Shingles is a reactivation of  chickenpox -more than 99% of Americans born before 1980 have had chickenpox even if they do not remember it. One in every 10 people who get shingles have severe long-lasting nerve pain as a result.   33 out of a 100 older adults will get shingles if they are unvaccinated.     This vaccine is very important for your health This vaccine is indicated for anyone 50 years or older. You can get this vaccine even if you have already had shingles because you can get the disease more than once in a lifetime.  Your risk for shingles and its complications increases with age.  This vaccine has 2 doses.  The second dose would be 2 to 6 months after the first dose.  If you had Zostavax vaccine in the past you should still get Shingrix. ( Zostavax is only 70% effective  and it loses significant strength over a few years .)  This vaccine is given through the pharmacy.  The cost of the vaccine is through your insurance. The pharmacy can inform you of the total costs.  Common side effects including soreness in the arm, some redness and swelling, also some feel fatigue muscle soreness headache low-grade fever.  Side effects typically go away within 2 to 3 days. Remember-the pain from shingles can last a lifetime but these side effects of the vaccine will only last a few days at most. It is very important to get both doses in order to protect yourself fully.   Please get this vaccine at your earliest convenience at your trusted pharmacy.      Thank you for coming for your annual wellness visit.  Please follow through on any advice that was given to you by today's visit. Remember to maintain compliance with your medications as discussed today.  Also remember it is important to eat a healthy diet and to stay physically active on a daily basis.  Please follow through with any testing or recommended followup office visits as was discussed today. You are due the following test coming up:  ALL-  Colonoscopy-not indicated            Vaccines-Flu today shingrix by early next year         Finally remembered that the annual wellness visit does not take the place of regularly scheduled office visits  chronic health problems such as hypertension/diabetes/cholesterol visits.

## 2019-07-31 ENCOUNTER — Telehealth: Payer: Self-pay | Admitting: Family Medicine

## 2019-07-31 MED ORDER — PRAVASTATIN SODIUM 40 MG PO TABS: 40.0000 mg | ORAL_TABLET | Freq: Every day | ORAL | 1 refills | Status: DC

## 2019-07-31 NOTE — Telephone Encounter (Signed)
Prescription sent electronically to pharmacy. Patient notified. 

## 2019-07-31 NOTE — Telephone Encounter (Signed)
patient is requesting refill on pravastatin 40 mg  3 month supply sent into [facility] order. She had her wellness on 07/11/2019

## 2019-11-23 DIAGNOSIS — Z85828 Personal history of other malignant neoplasm of skin: Secondary | ICD-10-CM | POA: Diagnosis not present

## 2019-11-23 DIAGNOSIS — E785 Hyperlipidemia, unspecified: Secondary | ICD-10-CM | POA: Diagnosis not present

## 2020-01-16 ENCOUNTER — Telehealth: Payer: Self-pay | Admitting: Family Medicine

## 2020-01-16 MED ORDER — PRAVASTATIN SODIUM 40 MG PO TABS: 40.0000 mg | ORAL_TABLET | Freq: Every day | ORAL | 0 refills | Status: DC

## 2020-01-16 NOTE — Telephone Encounter (Signed)
Patient has switched pharmacy to mail order on some of her medications .She is requesting refill on pravastatin 40 mg 3 month supply sent to [facility] [phone].

## 2020-01-16 NOTE — Telephone Encounter (Signed)
Rx sent to [facility] -- patient needs to be scheduled for an appointment.

## 2020-01-23 NOTE — Telephone Encounter (Signed)
Appointment schedule for 6/23 for 6 month followup

## 2020-02-14 ENCOUNTER — Ambulatory Visit (INDEPENDENT_AMBULATORY_CARE_PROVIDER_SITE_OTHER): Payer: Medicare PPO | Admitting: Family Medicine

## 2020-02-14 ENCOUNTER — Encounter: Payer: Self-pay | Admitting: Family Medicine

## 2020-02-14 ENCOUNTER — Other Ambulatory Visit: Payer: Self-pay

## 2020-02-14 VITALS — BP 104/68 | Temp 97.8°F | Wt 146.2 lb

## 2020-02-14 DIAGNOSIS — E782 Mixed hyperlipidemia: Secondary | ICD-10-CM | POA: Diagnosis not present

## 2020-02-14 MED ORDER — PRAVASTATIN SODIUM 40 MG PO TABS: 40.0000 mg | ORAL_TABLET | Freq: Every day | ORAL | 1 refills | Status: DC

## 2020-02-14 NOTE — Progress Notes (Signed)
   Subjective:    Patient ID: Nancy Kirby, female    DOB: 1927-06-15, 86 y.o.   MRN: [id number]  HPI Pt here today for refill of Pravastatin 40 mg once daily. Pt has brought a list of meds that she can be reimbursed for.  Very nice and very remarkable patient very independent.  Does have some arthritis.  Also has cholesterol issues.  She does need a form signed to help her get some of her medications reimbursed she does try to eat healthy and stay active.  Cognitive is doing well no falls denies being depressed  Review of Systems  Constitutional: Negative for activity change, appetite change and fatigue.  HENT: Negative for congestion and rhinorrhea.   Respiratory: Negative for cough and shortness of breath.   Cardiovascular: Negative for chest pain and leg swelling.  Gastrointestinal: Negative for abdominal pain and diarrhea.  Endocrine: Negative for polydipsia and polyphagia.  Skin: Negative for color change.  Neurological: Negative for dizziness and weakness.  Psychiatric/Behavioral: Negative for behavioral problems and confusion.       Objective:   Physical Exam Vitals reviewed.  Constitutional:      General: She is not in acute distress. HENT:     Head: Normocephalic and atraumatic.  Eyes:     General:        Right eye: No discharge.        Left eye: No discharge.  Neck:     Trachea: No tracheal deviation.  Cardiovascular:     Rate and Rhythm: Normal rate and regular rhythm.     Heart sounds: Normal heart sounds. No murmur heard.   Pulmonary:     Effort: Pulmonary effort is normal. No respiratory distress.     Breath sounds: Normal breath sounds.  Lymphadenopathy:     Cervical: No cervical adenopathy.  Skin:    General: Skin is warm and dry.  Neurological:     Mental Status: She is alert.     Coordination: Coordination normal.  Psychiatric:        Behavior: Behavior normal.           Assessment & Plan:  Osteoarthritis of the hands  stable Hyperlipidemia previous labs look good we will do more labs late in the fall with annual wellness visit in November  Healthy diet recommended Fall prevention discussed Patient doing well

## 2020-03-14 ENCOUNTER — Other Ambulatory Visit: Payer: Self-pay | Admitting: Family Medicine

## 2020-05-20 ENCOUNTER — Ambulatory Visit: Payer: Medicare PPO | Admitting: Family Medicine

## 2020-05-30 ENCOUNTER — Ambulatory Visit: Payer: Medicare PPO | Admitting: Orthopedic Surgery

## 2020-05-30 ENCOUNTER — Encounter: Payer: Self-pay | Admitting: Orthopedic Surgery

## 2020-05-30 ENCOUNTER — Other Ambulatory Visit: Payer: Self-pay

## 2020-05-30 ENCOUNTER — Ambulatory Visit: Payer: Medicare PPO

## 2020-05-30 VITALS — BP 162/86 | HR 99 | Ht 65.0 in | Wt 143.0 lb

## 2020-05-30 DIAGNOSIS — M25561 Pain in right knee: Secondary | ICD-10-CM

## 2020-05-30 DIAGNOSIS — G8929 Other chronic pain: Secondary | ICD-10-CM

## 2020-05-30 NOTE — Progress Notes (Signed)
Chief Complaint  Patient presents with  . Knee Pain    right knee/ swelling and painful     86 year old female presents after having some pain and swelling in her right knee and right hip she is not sure if it was radiating or not but is now asymptomatic  Review of systems patient takes 1 medication along with vitamins has no other major complaints she had a history of prior sciatica on the left side none on the right has had 1 occasion where the right leg gave way  She has chronic swelling of the right knee  Past Medical History:  Diagnosis Date  . Arthritis   . Osteopenia     BP (!) 162/86   Pulse 99   Ht 5\' 5"  (1.651 m)   Wt 143 lb (64.9 kg)   BMI 23.80 kg/m   She is awake alert and oriented x3 normal weight no gross deformities  She has a right knee effusion no tenderness full range of motion of the right knee.  Neurovascular exam is intact  X-ray shows arthritis and knee effusion  Assessment and plan  She is not symptomatic the knee effusion is chronic recommend no treatment at this time if she has trouble she is to call back for further evaluation

## 2020-06-06 ENCOUNTER — Telehealth: Payer: Self-pay

## 2020-06-06 DIAGNOSIS — Z79899 Other long term (current) drug therapy: Secondary | ICD-10-CM

## 2020-06-06 DIAGNOSIS — E782 Mixed hyperlipidemia: Secondary | ICD-10-CM

## 2020-06-06 DIAGNOSIS — Z0000 Encounter for general adult medical examination without abnormal findings: Secondary | ICD-10-CM

## 2020-06-06 NOTE — Telephone Encounter (Signed)
Last labs 06/20/19 lipid, liver, bmp, cbc

## 2020-06-06 NOTE — Telephone Encounter (Signed)
Patient is scheduled for a CPE on 11/22 and wants lab orders put in for [facility].  Patient aware to go one week prior to physical and to be fasting.  No need for call back once orders are in.

## 2020-06-09 NOTE — Telephone Encounter (Signed)
CBC, lipid, liver, metabolic 7, hyperlipidemia, screening

## 2020-06-10 NOTE — Telephone Encounter (Signed)
Lab orders placed and pt is aware 

## 2020-07-08 DIAGNOSIS — Z0000 Encounter for general adult medical examination without abnormal findings: Secondary | ICD-10-CM | POA: Diagnosis not present

## 2020-07-08 DIAGNOSIS — E782 Mixed hyperlipidemia: Secondary | ICD-10-CM | POA: Diagnosis not present

## 2020-07-08 DIAGNOSIS — Z79899 Other long term (current) drug therapy: Secondary | ICD-10-CM | POA: Diagnosis not present

## 2020-07-09 LAB — BASIC METABOLIC PANEL
BUN/Creatinine Ratio: 21 (ref 12–28)
BUN: 18 mg/dL (ref 10–36)
CO2: 25 mmol/L (ref 20–29)
Calcium: 8.9 mg/dL (ref 8.7–10.3)
Chloride: 104 mmol/L (ref 96–106)
Creatinine, Ser: 0.86 mg/dL (ref 0.57–1.00)
GFR calc Af Amer: 68 mL/min/{1.73_m2} (ref >59)
GFR calc non Af Amer: 59 mL/min/{1.73_m2} — ABNORMAL LOW (ref >59)
Glucose: 88 mg/dL (ref 65–99)
Potassium: 4.3 mmol/L (ref 3.5–5.2)
Sodium: 142 mmol/L (ref 134–144)

## 2020-07-09 LAB — CBC WITH DIFFERENTIAL/PLATELET
Basophils Absolute: 0.1 10*3/uL (ref 0.0–0.2)
Basos: 1 %
EOS (ABSOLUTE): 0.2 10*3/uL (ref 0.0–0.4)
Eos: 3 %
Hematocrit: 37.4 % (ref 34.0–46.6)
Hemoglobin: 12.5 g/dL (ref 11.1–15.9)
Immature Grans (Abs): 0 10*3/uL (ref 0.0–0.1)
Immature Granulocytes: 0 %
Lymphocytes Absolute: 2.2 10*3/uL (ref 0.7–3.1)
Lymphs: 33 %
MCH: 32.1 pg (ref 26.6–33.0)
MCHC: 33.4 g/dL (ref 31.5–35.7)
MCV: 96 fL (ref 79–97)
Monocytes Absolute: 0.6 10*3/uL (ref 0.1–0.9)
Monocytes: 9 %
Neutrophils Absolute: 3.5 10*3/uL (ref 1.4–7.0)
Neutrophils: 54 %
Platelets: 347 10*3/uL (ref 150–450)
RBC: 3.9 x10E6/uL (ref 3.77–5.28)
RDW: 12.8 % (ref 11.7–15.4)
WBC: 6.5 10*3/uL (ref 3.4–10.8)

## 2020-07-09 LAB — LIPID PANEL
Chol/HDL Ratio: 2.8 ratio (ref 0.0–4.4)
Cholesterol, Total: 179 mg/dL (ref 100–199)
HDL: 63 mg/dL (ref >39)
LDL Chol Calc (NIH): 99 mg/dL (ref 0–99)
Triglycerides: 95 mg/dL (ref 0–149)
VLDL Cholesterol Cal: 17 mg/dL (ref 5–40)

## 2020-07-09 LAB — HEPATIC FUNCTION PANEL
ALT: 9 IU/L (ref 0–32)
AST: 21 IU/L (ref 0–40)
Albumin: 3.8 g/dL (ref 3.5–4.6)
Alkaline Phosphatase: 96 IU/L (ref 44–121)
Bilirubin Total: 0.6 mg/dL (ref 0.0–1.2)
Bilirubin, Direct: 0.17 mg/dL (ref 0.00–0.40)
Total Protein: 6.2 g/dL (ref 6.0–8.5)

## 2020-07-15 ENCOUNTER — Other Ambulatory Visit: Payer: Self-pay

## 2020-07-15 ENCOUNTER — Ambulatory Visit (INDEPENDENT_AMBULATORY_CARE_PROVIDER_SITE_OTHER): Payer: Medicare PPO | Admitting: Family Medicine

## 2020-07-15 ENCOUNTER — Encounter: Payer: Self-pay | Admitting: Family Medicine

## 2020-07-15 VITALS — BP 118/68 | Temp 97.7°F | Ht 65.0 in | Wt 142.0 lb

## 2020-07-15 DIAGNOSIS — Z0000 Encounter for general adult medical examination without abnormal findings: Secondary | ICD-10-CM | POA: Diagnosis not present

## 2020-07-15 DIAGNOSIS — Z23 Encounter for immunization: Secondary | ICD-10-CM

## 2020-07-15 DIAGNOSIS — R270 Ataxia, unspecified: Secondary | ICD-10-CM | POA: Diagnosis not present

## 2020-07-15 NOTE — Progress Notes (Signed)
Subjective:    Patient ID: Nancy Kirby, female    DOB: October 05, 1926, 86 y.o.   MRN: [id number]  HPI AWV- Annual Wellness Visit Overall doing well exceptionally good for 86 years of age Denies any major setbacks except for recently she had a spell where she felt really dizzy and off-balance and it lasted for a good few hours then it got better.  She denied any unilateral numbness or weakness.  Denied any sweats chills wheezing or difficulty breathing. The patient was seen for their annual wellness visit. The patient's past medical history, surgical history, and family history were reviewed. Pertinent vaccines were reviewed ( tetanus, pneumonia, shingles, flu) The patient's medication list was reviewed and updated.  The height and weight were entered.  BMI recorded in electronic record elsewhere  Cognitive screening was completed. Outcome of Mini - Cog: pass   Falls /depression screening electronically recorded within record elsewhere  Current tobacco usage: none (All patients who use tobacco were given written and verbal information on quitting)  Recent listing of emergency department/hospitalizations over the past year were reviewed.  current specialist the patient sees on a regular basis: none   Medicare annual wellness visit patient questionnaire was reviewed.  A written screening schedule for the patient for the next 5-10 years was given. Appropriate discussion of followup regarding next visit was discussed.   Balance was off yesterday afternoon. Lasted about 4 hours.   Left ear cracking sounds for 2 weeks.   Would like to get rx for a cane.   Would like a flu vaccine.   Will get covid booster on December 16th.  During this balance spell she had she did not have any sensation of the room spinning but she did feel a buzzing in her left ear     Review of Systems  Constitutional: Negative for activity change, appetite change and fatigue.  HENT: Negative for  congestion and rhinorrhea.   Respiratory: Negative for cough and shortness of breath.   Cardiovascular: Negative for chest pain and leg swelling.  Gastrointestinal: Negative for abdominal pain and diarrhea.  Endocrine: Negative for polydipsia and polyphagia.  Skin: Negative for color change.  Neurological: Negative for dizziness and weakness.  Psychiatric/Behavioral: Negative for behavioral problems and confusion.       Objective:   Physical Exam Vitals reviewed.  Constitutional:      General: She is not in acute distress. HENT:     Head: Normocephalic and atraumatic.  Eyes:     General:        Right eye: No discharge.        Left eye: No discharge.  Neck:     Trachea: No tracheal deviation.  Cardiovascular:     Rate and Rhythm: Normal rate and regular rhythm.     Heart sounds: Normal heart sounds. No murmur heard.   Pulmonary:     Effort: Pulmonary effort is normal. No respiratory distress.     Breath sounds: Normal breath sounds.  Lymphadenopathy:     Cervical: No cervical adenopathy.  Skin:    General: Skin is warm and dry.  Neurological:     Mental Status: She is alert.     Coordination: Coordination normal.  Psychiatric:        Behavior: Behavior normal.    Finger-to-nose normal Romberg negative no nystagmus       Assessment & Plan:  Totally normal neurology exam.  Will connect with neurology to see if they would want [place] to do  a MRI on the patient.  It is possible this could be vestibular issue but she did not have this room spinning with it  Adult wellness-complete.wellness physical was conducted today. Importance of diet and exercise were discussed in detail.  In addition to this a discussion regarding safety was also covered. We also reviewed over immunizations and gave recommendations regarding current immunization needed for age.  In addition to this additional areas were also touched on including: Preventative health exams needed:  Colonoscopy not  indicated  Patient was advised yearly wellness exam

## 2020-07-15 NOTE — Patient Instructions (Addendum)
Results for orders placed or performed in visit on 06/06/20  CBC with Differential  Result Value Ref Range   WBC 6.5 3.4 - 10.8 x10E3/uL   RBC 3.90 3.77 - 5.28 x10E6/uL   Hemoglobin 12.5 11.1 - 15.9 g/dL   Hematocrit 29.9 24.2 - 46.6 %   MCV 96 79 - 97 fL   MCH 32.1 26.6 - 33.0 pg   MCHC 33.4 31 - 35 g/dL   RDW 68.3 41.9 - 62.2 %   Platelets 347 150 - 450 x10E3/uL   Neutrophils 54 Not Estab. %   Lymphs 33 Not Estab. %   Monocytes 9 Not Estab. %   Eos 3 Not Estab. %   Basos 1 Not Estab. %   Neutrophils Absolute 3.5 1.40 - 7.00 x10E3/uL   Lymphocytes Absolute 2.2 0 - 3 x10E3/uL   Monocytes Absolute 0.6 0 - 0 x10E3/uL   EOS (ABSOLUTE) 0.2 0.0 - 0.4 x10E3/uL   Basophils Absolute 0.1 0 - 0 x10E3/uL   Immature Granulocytes 0 Not Estab. %   Immature Grans (Abs) 0.0 0.0 - 0.1 x10E3/uL  Lipid Profile  Result Value Ref Range   Cholesterol, Total 179 100 - 199 mg/dL   Triglycerides 95 0 - 149 mg/dL   HDL 63 >29 mg/dL   VLDL Cholesterol Cal 17 5 - 40 mg/dL   LDL Chol Calc (NIH) 99 0 - 99 mg/dL   Chol/HDL Ratio 2.8 0.0 - 4.4 ratio  Hepatic function panel  Result Value Ref Range   Total Protein 6.2 6.0 - 8.5 g/dL   Albumin 3.8 3.5 - 4.6 g/dL   Bilirubin Total 0.6 0.0 - 1.2 mg/dL   Bilirubin, Direct 7.98 0.00 - 0.40 mg/dL   Alkaline Phosphatase 96 44 - 121 IU/L   AST 21 0 - 40 IU/L   ALT 9 0 - 32 IU/L  Basic Metabolic Panel (BMET)  Result Value Ref Range   Glucose 88 65 - 99 mg/dL   BUN 18 10 - 36 mg/dL   Creatinine, Ser 9.21 0.57 - 1.00 mg/dL   GFR calc non Af Amer 59 (L) >59 mL/min/1.73   GFR calc Af Amer 68 >59 mL/min/1.73   BUN/Creatinine Ratio 21 12 - 28   Sodium 142 134 - 144 mmol/L   Potassium 4.3 3.5 - 5.2 mmol/L   Chloride 104 96 - 106 mmol/L   CO2 25 20 - 29 mmol/L   Calcium 8.9 8.7 - 10.3 mg/dL   Labs were reviewed with patient Importance of staying healthy with eating habits discussed We will connect with neurology to see what they have to say regarding her  balance issues No shots indicated currently

## 2020-07-16 ENCOUNTER — Telehealth: Payer: Self-pay | Admitting: Family Medicine

## 2020-07-16 ENCOUNTER — Ambulatory Visit (HOSPITAL_COMMUNITY)
Admission: RE | Admit: 2020-07-16 | Discharge: 2020-07-16 | Disposition: A | Discharge status: Home / Self Care | Payer: Medicare PPO | Source: Ambulatory Visit | Attending: Family Medicine | Admitting: Family Medicine

## 2020-07-16 DIAGNOSIS — R270 Ataxia, unspecified: Secondary | ICD-10-CM | POA: Diagnosis not present

## 2020-07-16 DIAGNOSIS — R42 Dizziness and giddiness: Secondary | ICD-10-CM | POA: Diagnosis not present

## 2020-07-16 MED ORDER — GADOBUTROL 1 MMOL/ML IV SOLN
5.0000 mL | Freq: Once | INTRAVENOUS | Status: AC | PRN
  Administered 2020-07-16: 5 mL via INTRAVENOUS

## 2020-07-16 NOTE — Addendum Note (Signed)
Addended by: Margaretha Sheffield on: 07/16/2020 10:16 AM   Modules accepted: Orders

## 2020-07-16 NOTE — Telephone Encounter (Signed)
Nurses Yesterday I saw the patient for a wellness visit she was also stated over the weekend she had in balance/ataxia for several hours.  She is now doing much better.    Please communicate with patient let her know that we are working on setting her up for an MRI based upon recommendations of neurology.  She needs a urgent MRI-preferably this should be done this week if it is absolutely impossible to get done this week then Monday but it is a urgent issue in other words it cannot be put off for several days/weeks.  Please initiate calling to set up this MRI Please order the following based upon neurology consultation:  MRI w/wo contrast given her age (state in the comments that you also want thin cuts through the [facility] as well as stroke protocol).

## 2020-07-16 NOTE — Telephone Encounter (Signed)
Patient scheduled today at 12pm.

## 2020-07-17 ENCOUNTER — Other Ambulatory Visit: Payer: Self-pay | Admitting: Family Medicine

## 2020-07-17 DIAGNOSIS — R270 Ataxia, unspecified: Secondary | ICD-10-CM

## 2020-10-02 ENCOUNTER — Telehealth: Payer: Self-pay | Admitting: Family Medicine

## 2020-10-02 MED ORDER — PRAVASTATIN SODIUM 40 MG PO TABS: 40.0000 mg | ORAL_TABLET | Freq: Every day | ORAL | 1 refills | Status: DC

## 2020-10-02 NOTE — Telephone Encounter (Signed)
Prescription sent electronically to pharmacy. Patient notified. 

## 2020-10-02 NOTE — Telephone Encounter (Signed)
Patient needing refill on pravastatin 40 mg sent into [facility] pharmacy mail order.

## 2021-03-11 IMAGING — MR MR HEAD WO/W CM
12 of 13 series · 37 of 48 positions shown · IV contrast (gadavist)
Comparison: Brain MRI 09/18/2014.

CLINICAL DATA: Ataxia. Dizziness, nonspecific. Additional history
provided by scanning technologist: Patient reports being off balance
for 1 day.

EXAM:
MRI HEAD WITHOUT AND WITH CONTRAST
TECHNIQUE: Multiplanar, multiecho pulse sequences of the brain and surrounding
structures were obtained without and with intravenous contrast.
CONTRAST:  5mL GADAVIST GADOBUTROL 1 MMOL/ML IV SOLN

[Series 9: DWI · axial · 3.0mm · 0.77mm/px · z∈[-66,+81]mm · 2 of 50 slices shown (1 of 4)]
[im 1/50]
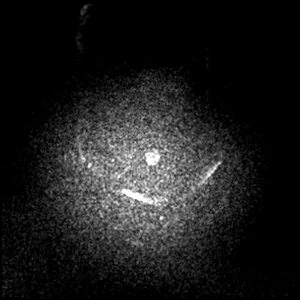
[im 50/50]
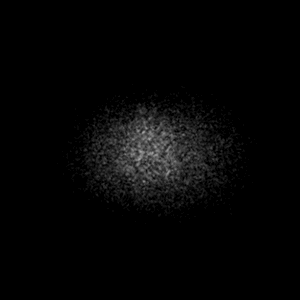

[Series 10: DWI · axial · 3.0mm · 0.77mm/px · z∈[-66,+81]mm · 2 of 50 slices shown (2 of 4)]
[im 1/50]
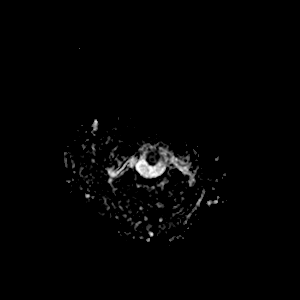
[im 50/50]
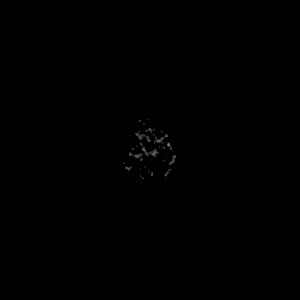

[Series 11: DWI · coronal · 5.0mm · 0.88mm/px · 2 of 28 slices shown (3 of 4)]
[im 1/28]
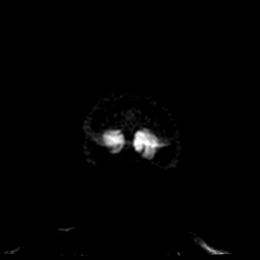
[im 28/28]
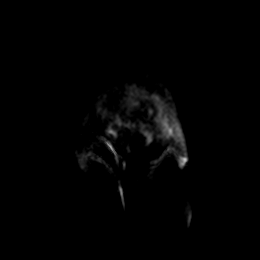

[Series 12: DWI · coronal · 5.0mm · 0.88mm/px · 2 of 28 slices shown (4 of 4)]
[im 1/28]
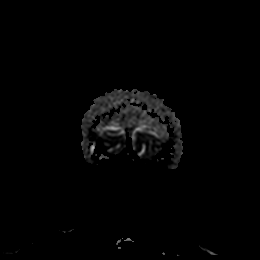
[im 28/28]
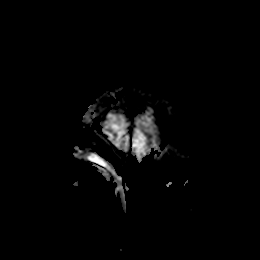

[Series 13: T1 · sagittal · 5.0mm · 0.75mm/px · 1 of 21 slices shown (1 of 2)]
[im 1/21]
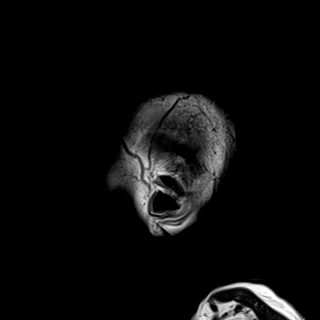

[Series 14: T2 · axial · 5.0mm · 0.72mm/px · 1 of 23 slices shown (1 of 2)]
[im 1/23]
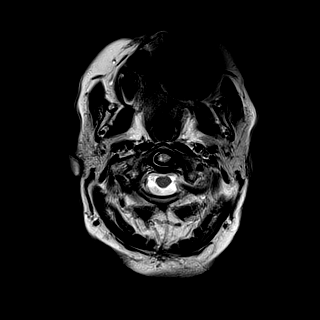

[Series 15: mag_images · axial · 3.0mm · 0.90mm/px · z∈[-79,+97]mm · 4 of 60 slices shown]
[im 1/60]
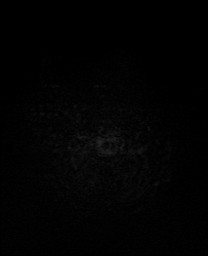
[im 20/60]
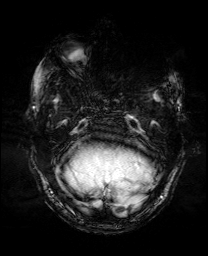
[im 40/60]
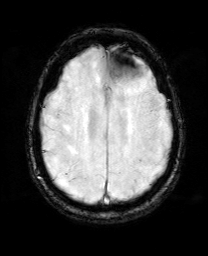
[im 60/60]
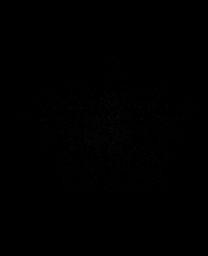

[Series 16: pha_images · axial · 3.0mm · 0.90mm/px · z∈[-79,+97]mm · 3 of 54 slices shown]
[im 1/54]
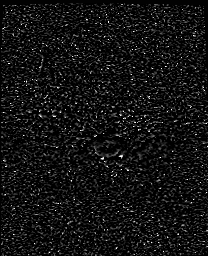
[im 27/54]
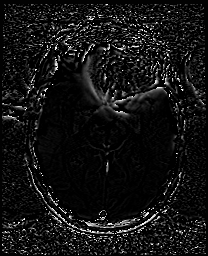
[im 54/54]
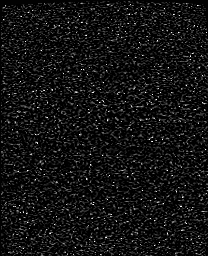

[Series 17: swi_images · axial · 3.0mm · 0.90mm/px · z∈[-79,+97]mm · 4 of 60 slices shown]
[im 1/60]
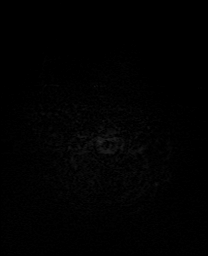
[im 20/60]
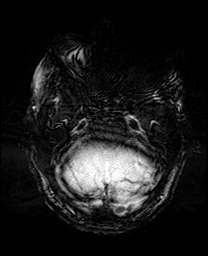
[im 40/60]
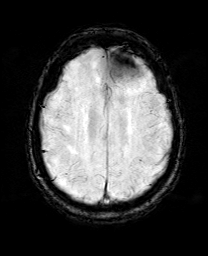
[im 60/60]
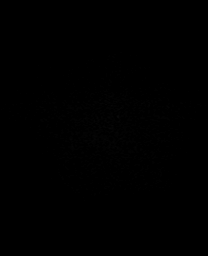

[Series 19: FLAIR · axial · 3.0mm · 0.45mm/px · z∈[-64,+82]mm · 3 of 50 slices shown]
[im 1/50]
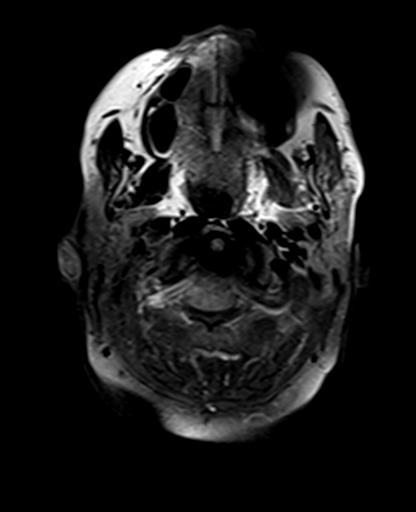
[im 25/50]
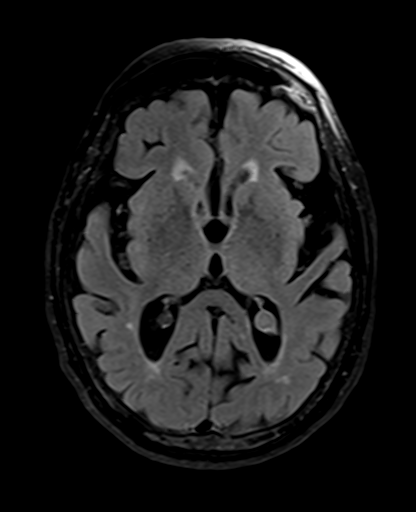
[im 50/50]
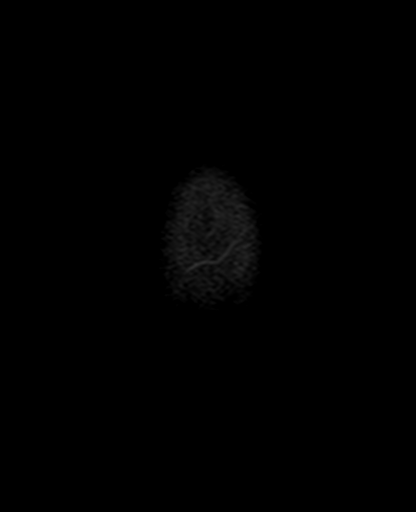

[Series 20: T1 · axial · 1.0mm · 0.98mm/px · z∈[-79,+96]mm · 11 of 176 slices shown (2 of 2)]
[im 1/176]
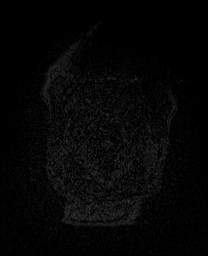
[im 18/176]
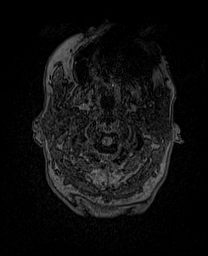
[im 36/176]
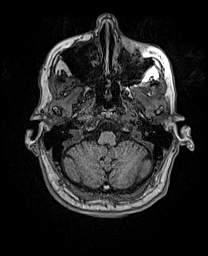
[im 53/176]
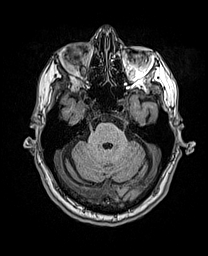
[im 71/176]
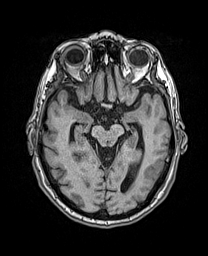
[im 88/176]
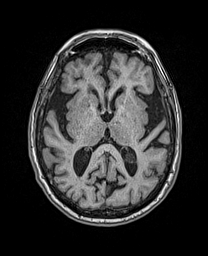
[im 106/176]
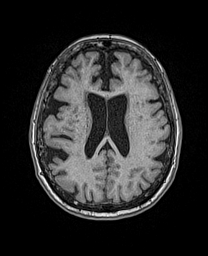
[im 123/176]
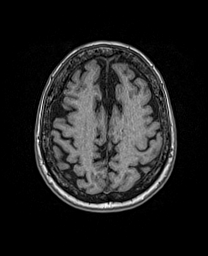
[im 141/176]
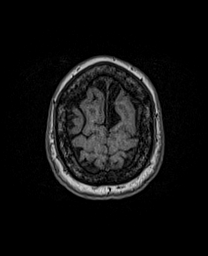
[im 158/176]
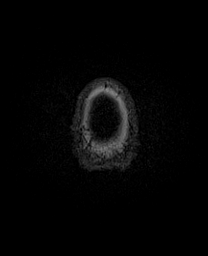
[im 176/176]
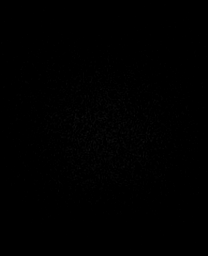

[Series 21: T2 · coronal · 5.0mm · 0.72mm/px · 2 of 28 slices shown (2 of 2)]
[im 1/28]
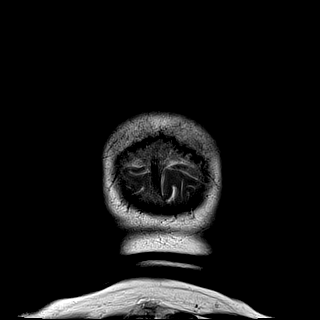
[im 28/28]
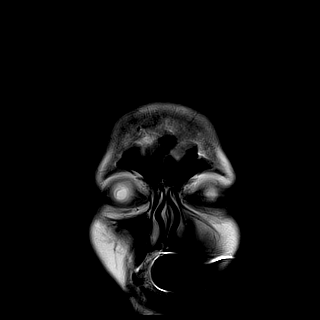

[37 of 48 positions shown; findings below may reference images not displayed]

FINDINGS: Brain:

Susceptibility artifact arising from the face region partially
obscures the intracranial contents on the axial SWI sequence. This
artifact also slightly limits evaluation on the diffusion-weighted
and axial T2/FLAIR sequences.

Mild for age generalized cerebral atrophy.

Mild for age multifocal T2/FLAIR hyperintensity within the cerebral
white matter which is nonspecific, but compatible with chronic small
vessel ischemic disease.

There is no acute infarct.

No evidence of intracranial mass.

No chronic intracranial blood products.

No extra-axial fluid collection.

No midline shift.

No abnormal intracranial enhancement.

Vascular: Expected proximal arterial flow voids.

Skull and upper cervical spine: No focal marrow lesion. Cervical
spondylosis

Sinuses/Orbits: Prior bilateral lens replacements. No significant
paranasal sinus disease at the imaged levels.
IMPRESSION: Susceptibility artifact arising from the face region, limiting
evaluation on some sequences as described.

No evidence of acute intracranial abnormality.

Mild cerebral atrophy and chronic small vessel ischemic disease, not
significantly progressed from the MRI of 09/18/2014.

## 2021-03-12 DIAGNOSIS — H353131 Nonexudative age-related macular degeneration, bilateral, early dry stage: Secondary | ICD-10-CM | POA: Diagnosis not present

## 2021-03-19 ENCOUNTER — Other Ambulatory Visit: Payer: Self-pay | Admitting: Family Medicine

## 2021-03-19 DIAGNOSIS — E782 Mixed hyperlipidemia: Secondary | ICD-10-CM

## 2021-06-19 ENCOUNTER — Other Ambulatory Visit: Payer: Self-pay

## 2021-06-19 ENCOUNTER — Telehealth: Payer: Self-pay | Admitting: Family Medicine

## 2021-06-19 DIAGNOSIS — Z0000 Encounter for general adult medical examination without abnormal findings: Secondary | ICD-10-CM

## 2021-06-19 DIAGNOSIS — E785 Hyperlipidemia, unspecified: Secondary | ICD-10-CM

## 2021-06-19 NOTE — Telephone Encounter (Signed)
Please order CBC, lipid, liver, metabolic 7 Diagnosis wellness diagnosis hyperlipidemia

## 2021-06-19 NOTE — Telephone Encounter (Signed)
Tried calling patient to inform labs have been ordered prior to annual exam appt, no voicemail is set up

## 2021-06-19 NOTE — Telephone Encounter (Signed)
Patient  needing labs for physical 11/29

## 2021-06-23 NOTE — Telephone Encounter (Signed)
Attempted to contact patient no answer °

## 2021-06-24 NOTE — Telephone Encounter (Signed)
Unable to contact patient; lab orders mailed to patient

## 2021-07-15 ENCOUNTER — Telehealth: Payer: Self-pay | Admitting: Family Medicine

## 2021-07-15 NOTE — Telephone Encounter (Signed)
No answer unable to leave a message for patient to call back and schedule Medicare Annual Wellness Visit (AWV) in office.   If unable to come into the office for AWV,  please offer to do virtually or by telephone.  Last AWV: 07/15/2020  Please schedule at anytime with [facility]Nurse Health Advisor.  40 minute appointment  Any questions, please contact me at [phone]

## 2021-07-21 DIAGNOSIS — Z0000 Encounter for general adult medical examination without abnormal findings: Secondary | ICD-10-CM | POA: Diagnosis not present

## 2021-07-21 DIAGNOSIS — E785 Hyperlipidemia, unspecified: Secondary | ICD-10-CM | POA: Diagnosis not present

## 2021-07-22 ENCOUNTER — Encounter: Payer: Self-pay | Admitting: Family Medicine

## 2021-07-22 ENCOUNTER — Ambulatory Visit (INDEPENDENT_AMBULATORY_CARE_PROVIDER_SITE_OTHER): Payer: Medicare PPO | Admitting: Family Medicine

## 2021-07-22 ENCOUNTER — Other Ambulatory Visit: Payer: Self-pay

## 2021-07-22 VITALS — BP 112/71 | Temp 97.9°F | Wt 141.4 lb

## 2021-07-22 DIAGNOSIS — Z0000 Encounter for general adult medical examination without abnormal findings: Secondary | ICD-10-CM

## 2021-07-22 DIAGNOSIS — E782 Mixed hyperlipidemia: Secondary | ICD-10-CM | POA: Diagnosis not present

## 2021-07-22 LAB — LIPID PANEL
Chol/HDL Ratio: 3.2 ratio (ref 0.0–4.4)
Cholesterol, Total: 166 mg/dL (ref 100–199)
HDL: 52 mg/dL (ref >39)
LDL Chol Calc (NIH): 94 mg/dL (ref 0–99)
Triglycerides: 109 mg/dL (ref 0–149)
VLDL Cholesterol Cal: 20 mg/dL (ref 5–40)

## 2021-07-22 LAB — COMPREHENSIVE METABOLIC PANEL
ALT: 9 IU/L (ref 0–32)
AST: 16 IU/L (ref 0–40)
Albumin/Globulin Ratio: 1.9 (ref 1.2–2.2)
Albumin: 3.7 g/dL (ref 3.5–4.6)
Alkaline Phosphatase: 86 IU/L (ref 44–121)
BUN/Creatinine Ratio: 21 (ref 12–28)
BUN: 18 mg/dL (ref 10–36)
Bilirubin Total: 0.4 mg/dL (ref 0.0–1.2)
CO2: 24 mmol/L (ref 20–29)
Calcium: 9.1 mg/dL (ref 8.7–10.3)
Chloride: 104 mmol/L (ref 96–106)
Creatinine, Ser: 0.84 mg/dL (ref 0.57–1.00)
Globulin, Total: 2 g/dL (ref 1.5–4.5)
Glucose: 98 mg/dL (ref 70–99)
Potassium: 4.3 mmol/L (ref 3.5–5.2)
Sodium: 142 mmol/L (ref 134–144)
Total Protein: 5.7 g/dL — ABNORMAL LOW (ref 6.0–8.5)
eGFR: 65 mL/min/{1.73_m2} (ref >59)

## 2021-07-22 LAB — CBC WITH DIFFERENTIAL/PLATELET
Basophils Absolute: 0.1 10*3/uL (ref 0.0–0.2)
Basos: 1 %
EOS (ABSOLUTE): 0.3 10*3/uL (ref 0.0–0.4)
Eos: 5 %
Hematocrit: 34.6 % (ref 34.0–46.6)
Hemoglobin: 11.8 g/dL (ref 11.1–15.9)
Immature Grans (Abs): 0 10*3/uL (ref 0.0–0.1)
Immature Granulocytes: 0 %
Lymphocytes Absolute: 2 10*3/uL (ref 0.7–3.1)
Lymphs: 30 %
MCH: 32.5 pg (ref 26.6–33.0)
MCHC: 34.1 g/dL (ref 31.5–35.7)
MCV: 95 fL (ref 79–97)
Monocytes Absolute: 0.7 10*3/uL (ref 0.1–0.9)
Monocytes: 11 %
Neutrophils Absolute: 3.5 10*3/uL (ref 1.4–7.0)
Neutrophils: 53 %
Platelets: 437 10*3/uL (ref 150–450)
RBC: 3.63 x10E6/uL — ABNORMAL LOW (ref 3.77–5.28)
RDW: 12.4 % (ref 11.7–15.4)
WBC: 6.6 10*3/uL (ref 3.4–10.8)

## 2021-07-22 NOTE — Progress Notes (Signed)
   Subjective:    Patient ID: Nancy Kirby, female    DOB: [birth year], 87 y.o.   MRN: [id number]  HPI AWV- Annual Wellness Visit  The patient was seen for their annual wellness visit. The patient's past medical history, surgical history, and family history were reviewed. Pertinent vaccines were reviewed ( tetanus, pneumonia, shingles, flu) The patient's medication list was reviewed and updated.  The height and weight were entered.  BMI recorded in electronic record elsewhere  Cognitive screening was completed. Outcome of Mini - Cog: pass   Falls /depression screening electronically recorded within record elsewhere  Current tobacco usage:none (All patients who  tobacco were given written and verbal information on quitting)  Recent listing of emergency department/hospitalizations over the past year were reviewed.  current specialist the patient sees on a regular basis: none   Medicare annual wellness visit patient questionnaire was reviewed.  A written screening schedule for the patient for the next 5-10 years was given. Appropriate discussion of followup regarding next visit was discussed.      Review of Systems     Objective:   Physical Exam General-in no acute distress Eyes-no discharge Lungs-respiratory rate normal, CTA CV-no murmurs,RRR Extremities skin warm dry no edema Neuro grossly normal Behavior normal, alert  No falls, denies depression, states good nutrition, able to see okay Able to still drive but limits her driving to local driving and more so daytime driving      Assessment & Plan:  Adult wellness-complete.wellness physical was conducted today. Importance of diet and exercise were discussed in detail.  In addition to this a discussion regarding safety was also covered. We also reviewed over immunizations and gave recommendations regarding current immunization needed for age.  In addition to this additional areas were also touched on  including: Preventative health exams needed:  Colonoscopy not indicated  Patient was advised yearly wellness exam  Hyperlipidemia on lipid profile looks good tolerates medicine well continue current medication healthy diet recommended  Very remarkable individual.  Overall doing very well

## 2021-10-24 ENCOUNTER — Telehealth: Payer: Self-pay | Admitting: Family Medicine

## 2021-10-24 DIAGNOSIS — E782 Mixed hyperlipidemia: Secondary | ICD-10-CM

## 2021-10-24 MED ORDER — PRAVASTATIN SODIUM 40 MG PO TABS
40.0000 mg | ORAL_TABLET | Freq: Every day | ORAL | 0 refills | Status: DC
Start: 2021-10-24 — End: 2022-01-27

## 2021-10-24 NOTE — Telephone Encounter (Signed)
Patient is requesting 3 month supply on pravastatin 40 mg sent into [facility] mail order ?

## 2021-10-24 NOTE — Telephone Encounter (Signed)
Prescription sent electronically to pharmacy. Patient notified. 

## 2021-11-05 DIAGNOSIS — D2339 Other benign neoplasm of skin of other parts of face: Secondary | ICD-10-CM | POA: Diagnosis not present

## 2021-12-05 DIAGNOSIS — I739 Peripheral vascular disease, unspecified: Secondary | ICD-10-CM | POA: Diagnosis not present

## 2021-12-05 DIAGNOSIS — R030 Elevated blood-pressure reading, without diagnosis of hypertension: Secondary | ICD-10-CM | POA: Diagnosis not present

## 2021-12-05 DIAGNOSIS — R32 Unspecified urinary incontinence: Secondary | ICD-10-CM | POA: Diagnosis not present

## 2021-12-05 DIAGNOSIS — E785 Hyperlipidemia, unspecified: Secondary | ICD-10-CM | POA: Diagnosis not present

## 2021-12-11 ENCOUNTER — Telehealth: Payer: Self-pay | Admitting: Family Medicine

## 2021-12-11 NOTE — Telephone Encounter (Signed)
Pt contacted. Pt states she is not having any discomfort or pain at this time. Pt declines follow up at this time.  ?

## 2021-12-11 NOTE — Telephone Encounter (Signed)
Nurses-please connect with patient-explained the situation to her.  If she is having significant leg pain and discomfort she needs to be seen quickly otherwise I would recommend a routine follow-up  with myself to discuss this further ?

## 2021-12-11 NOTE — Telephone Encounter (Signed)
Nancy Kirby from [facility] (In Home Visits for patients with [facility]) calling to notify us that the in home clinician did a P.A.D test and the measurement came back abnormal. Severe in left leg ?[id number] ? ?

## 2021-12-12 NOTE — Telephone Encounter (Signed)
VM not set up, unable to leave message

## 2021-12-12 NOTE — Telephone Encounter (Signed)
Pt states she will call if she needs anything ?

## 2021-12-12 NOTE — Telephone Encounter (Signed)
So noted thank you-if she does have ongoing leg pain or discomfort to follow-up ?

## 2022-01-27 ENCOUNTER — Encounter: Payer: Self-pay | Admitting: Family Medicine

## 2022-01-27 ENCOUNTER — Ambulatory Visit: Payer: Medicare PPO | Admitting: Family Medicine

## 2022-01-27 VITALS — BP 132/72 | HR 83 | Temp 97.5°F | Wt 146.6 lb

## 2022-01-27 DIAGNOSIS — E782 Mixed hyperlipidemia: Secondary | ICD-10-CM | POA: Diagnosis not present

## 2022-01-27 DIAGNOSIS — I739 Peripheral vascular disease, unspecified: Secondary | ICD-10-CM

## 2022-01-27 MED ORDER — PRAVASTATIN SODIUM 40 MG PO TABS: 40.0000 mg | ORAL_TABLET | Freq: Every day | ORAL | 2 refills | Status: DC

## 2022-01-27 NOTE — Progress Notes (Signed)
   Subjective:    Patient ID: Nancy Kirby, female    DOB: November 19, 1926, 88 y.o.   MRN: [id number]  HPI Pt presenting to follow up on leg swelling. Pt had doppler done by insurance and has report with her today. Pt has a spending account and in order for her to have vitamins paid for she will need written out scripts for vitamins.  Some leg edema on right side as the day goes on Also insurance sent nurse and test shows PAD She denies claudication  Review of Systems     Objective:   Physical Exam  General-in no acute distress Eyes-no discharge Lungs-respiratory rate normal, CTA CV-no murmurs,RRR Extremities skin warm dry no edema Neuro grossly normal Behavior normal, alert No ulcers no cyanosis Slight edema in left ankle     Assessment & Plan:   1. Mixed hyperlipidemia Continue cholesterol medication.  Follow lab work every 6 to 12 months - pravastatin (PRAVACHOL) 40 MG tablet; Take 1 tablet (40 mg total) by mouth daily.  Dispense: 90 tablet; Refill: 2  2. PAD (peripheral artery disease) ([place]) Patient has a home test with her traveling nurse was diagnosed with peripheral artery disease but does not have any coolness to the leg pulses are diminished no claudication patient would prefer to just watch this currently no sign of any ulcers or pressure sores  Follow-up by fall time  Also edema due to venous insufficiency- wear knee high on right side

## 2022-02-22 ENCOUNTER — Telehealth: Payer: Self-pay | Admitting: Family Medicine

## 2022-02-22 NOTE — Telephone Encounter (Signed)
error 

## 2022-05-04 ENCOUNTER — Telehealth: Payer: Self-pay | Admitting: Family Medicine

## 2022-05-04 DIAGNOSIS — E782 Mixed hyperlipidemia: Secondary | ICD-10-CM

## 2022-05-04 NOTE — Telephone Encounter (Signed)
Patient is requesting refill on pravastatin 40 mg called into [facility] patient is requesting also to let  [facility] know she has a new address which she has sent them to not send to old address

## 2022-05-05 MED ORDER — PRAVASTATIN SODIUM 40 MG PO TABS: 40.0000 mg | ORAL_TABLET | Freq: Every day | ORAL | 0 refills | Status: DC

## 2022-05-05 NOTE — Telephone Encounter (Signed)
Medication sent to pharmacy with note that pt has new address. Attempted to contact patient but no voicemail set up.

## 2022-05-21 NOTE — Telephone Encounter (Signed)
Telephone call- no voicemail- unable to get in touch with patient- med was to be delivered to her house address provided

## 2022-07-29 ENCOUNTER — Ambulatory Visit: Payer: Self-pay | Admitting: Family Medicine

## 2022-08-04 ENCOUNTER — Telehealth: Payer: Self-pay | Admitting: Family Medicine

## 2022-08-04 ENCOUNTER — Ambulatory Visit: Payer: Medicare PPO | Admitting: Family Medicine

## 2022-08-04 ENCOUNTER — Encounter: Payer: Self-pay | Admitting: Family Medicine

## 2022-08-04 VITALS — BP 142/72 | Ht 65.0 in | Wt 151.4 lb

## 2022-08-04 DIAGNOSIS — E782 Mixed hyperlipidemia: Secondary | ICD-10-CM

## 2022-08-04 DIAGNOSIS — N3944 Nocturnal enuresis: Secondary | ICD-10-CM

## 2022-08-04 DIAGNOSIS — Z0000 Encounter for general adult medical examination without abnormal findings: Secondary | ICD-10-CM

## 2022-08-04 MED ORDER — PRAVASTATIN SODIUM 40 MG PO TABS: 40.0000 mg | ORAL_TABLET | Freq: Every day | ORAL | 1 refills | Status: DC

## 2022-08-04 NOTE — Telephone Encounter (Signed)
Prescription sent electronically to pharmacy. Patient notified. 

## 2022-08-04 NOTE — Telephone Encounter (Signed)
Patient need refill on pravastatin 40 mg sent to [facility] mailorder.

## 2022-08-04 NOTE — Progress Notes (Signed)
   Subjective:    Patient ID: Nancy Kirby, female    DOB: 04-19-1927, 88 y.o.   MRN: [id number]  HPI Pt arrives for follow up. Pt would like discuss her getting up at night (3-4 times) to use bathroom. Pt states it is not cystitis and she is wearing pads.  She denies dysuria or hematuria  Pt had a script for Preservision from Dr.Cotter and pt would like Dr.Elverna Caffee to write a script for this so insurance to cover.  Patient states she is gradually losing the ability to see small print she has seen the eye doctor he has her on a supplement we went ahead and wrote a prescription so her insurance will cover it  The patient comes in today for a wellness visit.    A review of their health history was completed.  A review of medications was also completed.  Any needed refills; does need refills on pravastatin  Eating habits: Overall good eating habits  Falls/  MVA accidents in past few months: No falls or injury Depression negative   Regular exercise: Does fit in walking  Specialist pt sees on regular basis: Sees eye doctor once a year  Preventative health issues were discussed.   Additional concerns: Please see his symptoms above regarding urination   Review of Systems     Objective:   Physical Exam General-in no acute distress Eyes-no discharge Lungs-respiratory rate normal, CTA CV-no murmurs,RRR Extremities skin warm dry small amount of edema in the legs Neuro grossly normal Behavior normal, alert        Assessment & Plan:  1. Encounter for subsequent annual wellness visit (AWV) in Medicare patient Adult wellness-complete.wellness physical was conducted today. Importance of diet and exercise were discussed in detail.  Importance of stress reduction and healthy living were discussed.  In addition to this a discussion regarding safety was also covered.  We also reviewed over immunizations and gave recommendations regarding current immunization needed for age.   Not In addition to this additional areas were also touched on including: Preventative health exams needed:  Colonoscopy not indicated  Patient was advised yearly wellness exam  - CBC with Differential - Lipid panel - Comprehensive metabolic panel - Urinalysis, Routine w reflex microscopic  2. Mixed hyperlipidemia Healthy diet continue medication check lab work - CBC with Differential - Lipid panel - Comprehensive metabolic panel - Urinalysis, Routine w reflex microscopic  3. Urinary incontinence, nocturnal enuresis Check urine does not sound like infection patient does drink some fluids in the evening encouraged her to cut back on this - CBC with Differential - Lipid panel - Comprehensive metabolic panel - Urinalysis, Routi.bswne w reflex microscopic

## 2022-08-05 LAB — CBC WITH DIFFERENTIAL/PLATELET
Basophils Absolute: 0.1 10*3/uL (ref 0.0–0.2)
Basos: 1 %
EOS (ABSOLUTE): 0.2 10*3/uL (ref 0.0–0.4)
Eos: 2 %
Hematocrit: 40 % (ref 34.0–46.6)
Hemoglobin: 13 g/dL (ref 11.1–15.9)
Immature Grans (Abs): 0 10*3/uL (ref 0.0–0.1)
Immature Granulocytes: 0 %
Lymphocytes Absolute: 2.5 10*3/uL (ref 0.7–3.1)
Lymphs: 27 %
MCH: 31.3 pg (ref 26.6–33.0)
MCHC: 32.5 g/dL (ref 31.5–35.7)
MCV: 96 fL (ref 79–97)
Monocytes Absolute: 1 10*3/uL — ABNORMAL HIGH (ref 0.1–0.9)
Monocytes: 10 %
Neutrophils Absolute: 5.7 10*3/uL (ref 1.4–7.0)
Neutrophils: 60 %
Platelets: 342 10*3/uL (ref 150–450)
RBC: 4.15 x10E6/uL (ref 3.77–5.28)
RDW: 12.5 % (ref 11.7–15.4)
WBC: 9.5 10*3/uL (ref 3.4–10.8)

## 2022-08-05 LAB — COMPREHENSIVE METABOLIC PANEL
ALT: 12 IU/L (ref 0–32)
AST: 19 IU/L (ref 0–40)
Albumin/Globulin Ratio: 2 (ref 1.2–2.2)
Albumin: 4.3 g/dL (ref 3.6–4.6)
Alkaline Phosphatase: 102 IU/L (ref 44–121)
BUN/Creatinine Ratio: 25 (ref 12–28)
BUN: 22 mg/dL (ref 10–36)
Bilirubin Total: 0.3 mg/dL (ref 0.0–1.2)
CO2: 26 mmol/L (ref 20–29)
Calcium: 9 mg/dL (ref 8.7–10.3)
Chloride: 100 mmol/L (ref 96–106)
Creatinine, Ser: 0.89 mg/dL (ref 0.57–1.00)
Globulin, Total: 2.2 g/dL (ref 1.5–4.5)
Glucose: 87 mg/dL (ref 70–99)
Potassium: 4.1 mmol/L (ref 3.5–5.2)
Sodium: 141 mmol/L (ref 134–144)
Total Protein: 6.5 g/dL (ref 6.0–8.5)
eGFR: 60 mL/min/{1.73_m2} (ref >59)

## 2022-08-05 LAB — URINALYSIS, ROUTINE W REFLEX MICROSCOPIC
Bilirubin, UA: NEGATIVE
Glucose, UA: NEGATIVE
Ketones, UA: NEGATIVE
Nitrite, UA: POSITIVE — AB
RBC, UA: NEGATIVE
Specific Gravity, UA: 1.021 (ref 1.005–1.030)
Urobilinogen, Ur: 0.2 mg/dL (ref 0.2–1.0)
pH, UA: 6 (ref 5.0–7.5)

## 2022-08-05 LAB — LIPID PANEL
Chol/HDL Ratio: 3.1 ratio (ref 0.0–4.4)
Cholesterol, Total: 187 mg/dL (ref 100–199)
HDL: 61 mg/dL (ref >39)
LDL Chol Calc (NIH): 100 mg/dL — ABNORMAL HIGH (ref 0–99)
Triglycerides: 153 mg/dL — ABNORMAL HIGH (ref 0–149)
VLDL Cholesterol Cal: 26 mg/dL (ref 5–40)

## 2022-08-05 LAB — MICROSCOPIC EXAMINATION
Casts: NONE SEEN /lpf
Epithelial Cells (non renal): NONE SEEN /hpf (ref 0–10)
RBC, Urine: NONE SEEN /hpf (ref 0–2)
WBC, UA: >30 /hpf — AB (ref 0–5)

## 2022-08-07 MED ORDER — CEPHALEXIN 500 MG PO CAPS: 500.0000 mg | ORAL_CAPSULE | Freq: Three times a day (TID) | ORAL | 0 refills | Status: DC

## 2022-08-07 NOTE — Addendum Note (Signed)
Addended by: Margaretha Sheffield on: 08/07/2022 04:44 PM   Modules accepted: Orders

## 2022-08-25 DIAGNOSIS — R000 Tachycardia, unspecified: Secondary | ICD-10-CM | POA: Diagnosis not present

## 2022-08-25 DIAGNOSIS — E785 Hyperlipidemia, unspecified: Secondary | ICD-10-CM | POA: Diagnosis not present

## 2022-08-25 DIAGNOSIS — J441 Chronic obstructive pulmonary disease with (acute) exacerbation: Secondary | ICD-10-CM | POA: Diagnosis not present

## 2022-08-25 DIAGNOSIS — E860 Dehydration: Secondary | ICD-10-CM | POA: Diagnosis not present

## 2022-08-25 DIAGNOSIS — R531 Weakness: Secondary | ICD-10-CM | POA: Diagnosis not present

## 2022-08-25 DIAGNOSIS — J189 Pneumonia, unspecified organism: Secondary | ICD-10-CM | POA: Diagnosis not present

## 2022-08-25 DIAGNOSIS — Z792 Long term (current) use of antibiotics: Secondary | ICD-10-CM | POA: Diagnosis not present

## 2022-08-25 DIAGNOSIS — R918 Other nonspecific abnormal finding of lung field: Secondary | ICD-10-CM | POA: Diagnosis not present

## 2022-08-25 DIAGNOSIS — J440 Chronic obstructive pulmonary disease with acute lower respiratory infection: Secondary | ICD-10-CM | POA: Diagnosis not present

## 2022-08-25 DIAGNOSIS — J45901 Unspecified asthma with (acute) exacerbation: Secondary | ICD-10-CM | POA: Diagnosis not present

## 2022-08-25 DIAGNOSIS — R0602 Shortness of breath: Secondary | ICD-10-CM | POA: Diagnosis not present

## 2022-08-25 DIAGNOSIS — Z20822 Contact with and (suspected) exposure to covid-19: Secondary | ICD-10-CM | POA: Diagnosis not present

## 2022-08-25 DIAGNOSIS — J45909 Unspecified asthma, uncomplicated: Secondary | ICD-10-CM | POA: Diagnosis not present

## 2022-08-25 DIAGNOSIS — R069 Unspecified abnormalities of breathing: Secondary | ICD-10-CM | POA: Diagnosis not present

## 2022-08-25 DIAGNOSIS — R059 Cough, unspecified: Secondary | ICD-10-CM | POA: Diagnosis not present

## 2022-08-25 DIAGNOSIS — R0902 Hypoxemia: Secondary | ICD-10-CM | POA: Diagnosis not present

## 2022-08-25 DIAGNOSIS — Z66 Do not resuscitate: Secondary | ICD-10-CM | POA: Diagnosis not present

## 2022-08-26 DIAGNOSIS — Z792 Long term (current) use of antibiotics: Secondary | ICD-10-CM | POA: Diagnosis not present

## 2022-08-26 DIAGNOSIS — R531 Weakness: Secondary | ICD-10-CM | POA: Diagnosis not present

## 2022-08-26 DIAGNOSIS — J189 Pneumonia, unspecified organism: Secondary | ICD-10-CM | POA: Diagnosis not present

## 2022-08-26 DIAGNOSIS — J45909 Unspecified asthma, uncomplicated: Secondary | ICD-10-CM | POA: Diagnosis not present

## 2022-08-27 DIAGNOSIS — R531 Weakness: Secondary | ICD-10-CM | POA: Diagnosis not present

## 2022-08-27 DIAGNOSIS — J189 Pneumonia, unspecified organism: Secondary | ICD-10-CM | POA: Diagnosis not present

## 2022-08-27 DIAGNOSIS — J45901 Unspecified asthma with (acute) exacerbation: Secondary | ICD-10-CM | POA: Diagnosis not present

## 2022-08-27 DIAGNOSIS — Z792 Long term (current) use of antibiotics: Secondary | ICD-10-CM | POA: Diagnosis not present

## 2022-08-28 ENCOUNTER — Telehealth: Payer: Self-pay

## 2022-08-28 ENCOUNTER — Telehealth: Payer: Self-pay | Admitting: *Deleted

## 2022-08-28 NOTE — Progress Notes (Signed)
  Care Coordination  Note  08/28/2022 Name: Nancy Kirby MRN: [id number] DOB: 08/20/1927  Nancy Kirby is a 89 y.o. year old primary care patient of Luking, Elayne Snare, MD. I reached out to Nancy Kirby by phone today to assist with scheduling a follow up appointment. Nancy Kirby verbally consented to my assistance.       Follow up plan: Hospital Follow Up appointment scheduled with (Dr. Wolfgang Phoenix) on (09/02/22) at (10:40 AM).  [place]  Direct Dial: [phone]

## 2022-08-28 NOTE — Patient Outreach (Signed)
  Care Coordination TOC Note Transition Care Management Follow-up Telephone Call Date of discharge and from where: [facility] 08/27/22 How have you been since you were released from the hospital? "I am still raspy but I am better, eating and up and about". Any questions or concerns? No  Items Reviewed: Did the pt receive and understand the discharge instructions provided? Yes  Medications obtained and verified? Yes  Other? No  Any new allergies since your discharge? No  Dietary orders reviewed? Yes Do you have support at home? No   Home Care and Equipment/Supplies: Were home health services ordered? no If so, what is the name of the agency? N/a  Has the agency set up a time to come to the patient's home? no Were any new equipment or medical supplies ordered?  No What is the name of the medical supply agency? N/a Were you able to get the supplies/equipment? not applicable Do you have any questions related to the use of the equipment or supplies? No  Functional Questionnaire: (I = Independent and D = Dependent) ADLs: I  Bathing/Dressing- I  Meal Prep- D  Eating- I  Maintaining continence- I  Transferring/Ambulation- I  Managing Meds- I  Follow up appointments reviewed:  PCP Hospital f/u appt confirmed? No   Specialist Hospital f/u appt confirmed? No   Are transportation arrangements needed? No  If their condition worsens, is the pt aware to call PCP or go to the Emergency Dept.? Yes Was the patient provided with contact information for the PCP's office or ED? Yes Was to pt encouraged to call back with questions or concerns? Yes  SDOH assessments and interventions completed:   Yes SDOH Interventions Today    Flowsheet Row Most Recent Value  SDOH Interventions   Food Insecurity Interventions Intervention Not Indicated  [Patient lives in senior group home and they make meals for her]  Housing Interventions Intervention Not Indicated  Transportation Interventions Intervention  Not Indicated  Utilities Interventions Intervention Not Indicated       Care Coordination Interventions:  PCP follow up appointment requested   Encounter Outcome:  Pt. Visit Completed

## 2022-09-02 ENCOUNTER — Encounter: Payer: Self-pay | Admitting: Family Medicine

## 2022-09-02 ENCOUNTER — Ambulatory Visit (INDEPENDENT_AMBULATORY_CARE_PROVIDER_SITE_OTHER): Payer: Medicare HMO | Admitting: Family Medicine

## 2022-09-02 VITALS — BP 125/75 | Wt 148.4 lb

## 2022-09-02 DIAGNOSIS — D72829 Elevated white blood cell count, unspecified: Secondary | ICD-10-CM | POA: Diagnosis not present

## 2022-09-02 DIAGNOSIS — J189 Pneumonia, unspecified organism: Secondary | ICD-10-CM | POA: Diagnosis not present

## 2022-09-02 NOTE — Progress Notes (Signed)
   Subjective:    Patient ID: Nancy Kirby, female    DOB: July 10, 1927, 89 y.o.   MRN: [id number]  HPI Pt arrives for hospital follow up. Pt was at [facility] from 08/25/22-08/27/22. Pt states she had pneumonia and her oxygen "went out on her". Pt states she is feeling better today.  Follow-up Hospital records reviewed X-ray reviewed Patient I talked with her at length about her symptoms hospitalization and after hospitalization She still has some fatigue tiredness but denies any high fever chills sweats  Review of Systems     Objective:   Physical Exam Lungs clear hearts regular extremities trace edema in the ankles pulse normal BP good       Assessment & Plan:  Transition of care Doing well overall X-ray at the end of the month Lab work end of the month Follow-up if progressive troubles Follow-up by late spring early summer Follow-up sooner if any problems Warning signs regarding pneumonia and follow-up were discussed

## 2022-09-09 DIAGNOSIS — D72829 Elevated white blood cell count, unspecified: Secondary | ICD-10-CM | POA: Diagnosis not present

## 2022-09-10 LAB — CBC WITH DIFFERENTIAL/PLATELET
Basophils Absolute: 0.1 10*3/uL (ref 0.0–0.2)
Basos: 1 %
EOS (ABSOLUTE): 0.2 10*3/uL (ref 0.0–0.4)
Eos: 3 %
Hematocrit: 39 % (ref 34.0–46.6)
Hemoglobin: 12.7 g/dL (ref 11.1–15.9)
Immature Grans (Abs): 0 10*3/uL (ref 0.0–0.1)
Immature Granulocytes: 0 %
Lymphocytes Absolute: 2 10*3/uL (ref 0.7–3.1)
Lymphs: 24 %
MCH: 31.8 pg (ref 26.6–33.0)
MCHC: 32.6 g/dL (ref 31.5–35.7)
MCV: 98 fL — ABNORMAL HIGH (ref 79–97)
Monocytes Absolute: 0.9 10*3/uL (ref 0.1–0.9)
Monocytes: 11 %
Neutrophils Absolute: 4.9 10*3/uL (ref 1.4–7.0)
Neutrophils: 61 %
Platelets: 352 10*3/uL (ref 150–450)
RBC: 4 x10E6/uL (ref 3.77–5.28)
RDW: 12.7 % (ref 11.7–15.4)
WBC: 8 10*3/uL (ref 3.4–10.8)

## 2022-09-10 LAB — BASIC METABOLIC PANEL
BUN/Creatinine Ratio: 24 (ref 12–28)
BUN: 21 mg/dL (ref 10–36)
CO2: 24 mmol/L (ref 20–29)
Calcium: 8.8 mg/dL (ref 8.7–10.3)
Chloride: 101 mmol/L (ref 96–106)
Creatinine, Ser: 0.88 mg/dL (ref 0.57–1.00)
Glucose: 100 mg/dL — ABNORMAL HIGH (ref 70–99)
Potassium: 4.4 mmol/L (ref 3.5–5.2)
Sodium: 139 mmol/L (ref 134–144)
eGFR: 61 mL/min/{1.73_m2} (ref >59)

## 2022-09-22 ENCOUNTER — Ambulatory Visit (HOSPITAL_COMMUNITY)
Admission: RE | Admit: 2022-09-22 | Discharge: 2022-09-22 | Disposition: A | Discharge status: Home / Self Care | Payer: Medicare HMO | Source: Ambulatory Visit | Attending: Family Medicine | Admitting: Family Medicine

## 2022-09-22 DIAGNOSIS — J189 Pneumonia, unspecified organism: Secondary | ICD-10-CM | POA: Insufficient documentation

## 2023-01-11 ENCOUNTER — Telehealth: Payer: Self-pay | Admitting: Family Medicine

## 2023-01-11 ENCOUNTER — Other Ambulatory Visit: Payer: Self-pay | Admitting: Family Medicine

## 2023-01-11 DIAGNOSIS — E782 Mixed hyperlipidemia: Secondary | ICD-10-CM

## 2023-01-11 MED ORDER — PRAVASTATIN SODIUM 40 MG PO TABS: 40.0000 mg | ORAL_TABLET | Freq: Every day | ORAL | 1 refills | Status: DC

## 2023-01-11 NOTE — Telephone Encounter (Signed)
Refill on pravastatin (PRAVACHOL) 40 MG tablet  90 day supply plus one refill [facility] make sure its sent to her new address [address] [place] [address]

## 2023-01-11 NOTE — Telephone Encounter (Signed)
Prescription sent in please keep all follow-up visits

## 2023-02-02 ENCOUNTER — Ambulatory Visit (INDEPENDENT_AMBULATORY_CARE_PROVIDER_SITE_OTHER): Payer: Medicare HMO | Admitting: Family Medicine

## 2023-02-02 VITALS — BP 132/76 | HR 77 | Wt 155.2 lb

## 2023-02-02 DIAGNOSIS — H3530 Unspecified macular degeneration: Secondary | ICD-10-CM

## 2023-02-02 DIAGNOSIS — E785 Hyperlipidemia, unspecified: Secondary | ICD-10-CM | POA: Diagnosis not present

## 2023-02-02 DIAGNOSIS — N3944 Nocturnal enuresis: Secondary | ICD-10-CM

## 2023-02-02 DIAGNOSIS — H348122 Central retinal vein occlusion, left eye, stable: Secondary | ICD-10-CM | POA: Diagnosis not present

## 2023-02-02 NOTE — Progress Notes (Signed)
   Subjective:    Patient ID: Nancy Kirby, female    DOB: 02/13/1927, 89 y.o.   MRN: [id number]  HPI Patient arrives today for 5 month follow up.  Patient lives at local assisted living and has her own apartment there Overall she is doing well She denies any major setbacks She tries to eat relatively healthy She does have overactive bladder issues Some leakage that she has to use pads for plus also some urination at nighttime Patient states she is having an overactive bladder.   Patient would like RX for PreserVision if script is written insurance will pay for it.  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 Hyperlipidemia, unspecified hyperlipidemia type Continue medication it would be fine to do lab work before her next visit late in December  2. Urinary incontinence, nocturnal enuresis We did discuss risk and benefits of anticholinergics given the risk that it could cause cognitive issues she will hold off on any treatment  3. Macular degeneration of right eye, unspecified type Unable to read listens to music  4. Central retinal vein occlusion of left eye, unspecified complication status Has legal blindness left eye risk of falls was discussed  Follow-up 6 months

## 2023-03-09 DIAGNOSIS — H353131 Nonexudative age-related macular degeneration, bilateral, early dry stage: Secondary | ICD-10-CM | POA: Diagnosis not present

## 2023-03-24 DIAGNOSIS — H35433 Paving stone degeneration of retina, bilateral: Secondary | ICD-10-CM | POA: Diagnosis not present

## 2023-03-24 DIAGNOSIS — H35363 Drusen (degenerative) of macula, bilateral: Secondary | ICD-10-CM | POA: Diagnosis not present

## 2023-03-24 DIAGNOSIS — H353113 Nonexudative age-related macular degeneration, right eye, advanced atrophic without subfoveal involvement: Secondary | ICD-10-CM | POA: Diagnosis not present

## 2023-03-24 DIAGNOSIS — H353124 Nonexudative age-related macular degeneration, left eye, advanced atrophic with subfoveal involvement: Secondary | ICD-10-CM | POA: Diagnosis not present

## 2023-04-16 DIAGNOSIS — H353113 Nonexudative age-related macular degeneration, right eye, advanced atrophic without subfoveal involvement: Secondary | ICD-10-CM | POA: Diagnosis not present

## 2023-05-28 DIAGNOSIS — H35363 Drusen (degenerative) of macula, bilateral: Secondary | ICD-10-CM | POA: Diagnosis not present

## 2023-05-28 DIAGNOSIS — H43813 Vitreous degeneration, bilateral: Secondary | ICD-10-CM | POA: Diagnosis not present

## 2023-05-28 DIAGNOSIS — H353124 Nonexudative age-related macular degeneration, left eye, advanced atrophic with subfoveal involvement: Secondary | ICD-10-CM | POA: Diagnosis not present

## 2023-05-28 DIAGNOSIS — H353113 Nonexudative age-related macular degeneration, right eye, advanced atrophic without subfoveal involvement: Secondary | ICD-10-CM | POA: Diagnosis not present

## 2023-06-09 ENCOUNTER — Other Ambulatory Visit: Payer: Self-pay | Admitting: Family Medicine

## 2023-06-09 DIAGNOSIS — E782 Mixed hyperlipidemia: Secondary | ICD-10-CM

## 2023-06-10 ENCOUNTER — Ambulatory Visit: Payer: Medicare HMO | Admitting: Family Medicine

## 2023-07-01 DIAGNOSIS — H353124 Nonexudative age-related macular degeneration, left eye, advanced atrophic with subfoveal involvement: Secondary | ICD-10-CM | POA: Diagnosis not present

## 2023-07-01 DIAGNOSIS — H35363 Drusen (degenerative) of macula, bilateral: Secondary | ICD-10-CM | POA: Diagnosis not present

## 2023-07-01 DIAGNOSIS — H43813 Vitreous degeneration, bilateral: Secondary | ICD-10-CM | POA: Diagnosis not present

## 2023-07-01 DIAGNOSIS — H353113 Nonexudative age-related macular degeneration, right eye, advanced atrophic without subfoveal involvement: Secondary | ICD-10-CM | POA: Diagnosis not present

## 2023-07-27 ENCOUNTER — Ambulatory Visit: Payer: Self-pay | Admitting: Family Medicine

## 2023-07-27 ENCOUNTER — Ambulatory Visit
Admission: EM | Admit: 2023-07-27 | Discharge: 2023-07-27 | Disposition: A | Discharge status: Home / Self Care | Payer: Medicare HMO | Attending: Family Medicine | Admitting: Family Medicine

## 2023-07-27 DIAGNOSIS — B029 Zoster without complications: Secondary | ICD-10-CM

## 2023-07-27 MED ORDER — VALACYCLOVIR HCL 1 G PO TABS
1000.0000 mg | ORAL_TABLET | Freq: Three times a day (TID) | ORAL | 0 refills | Status: DC
Start: 2023-07-27 — End: 2023-08-03

## 2023-07-27 MED ORDER — DEXAMETHASONE SODIUM PHOSPHATE 10 MG/ML IJ SOLN
10.0000 mg | Freq: Once | INTRAMUSCULAR | Status: AC
  Administered 2023-07-27: 10 mg via INTRAMUSCULAR

## 2023-07-27 MED ORDER — GABAPENTIN 100 MG PO CAPS: 100.0000 mg | ORAL_CAPSULE | Freq: Three times a day (TID) | ORAL | 0 refills | Status: DC | PRN

## 2023-07-27 NOTE — Telephone Encounter (Signed)
Copied from CRM [id number]. Topic: Clinical - Red Word Triage >> Jul 27, 2023  9:37 AM Fuller Mandril wrote: Red Word that prompted transfer to Nurse Triage: Shingles Painful - worsening  Chief Complaint: shingles Symptoms: rash between shoulder blades and comes around to right side Frequency: started 3 days ago Pertinent Negatives: Patient denies fever Disposition: [] ED /[x] Urgent Care (no appt availability in office) / [] Appointment(In office/virtual)/ []  [facility]/ [] Home Care/ [] Refused Recommended Disposition /[]  Mobile Bus/ []  Follow-up with PCP Additional Notes: patient reports she has shingles, she is retired [employer] fund[employer].  States started three days ago, looks similar to a bruise and started between shoulder blades and travels around to right side.  States pain is a dull ache and that she had received the shingles vaccine.  States has been using tylenol for the past three days and it is not helping the pain. No apt. Available today, patient instructed to see Dr. Within 24 hours, states will go to urgent care today.   Reason for Disposition  [1] Shingles rash (matches SYMPTOMS) AND [2] onset < 72 hours ago (3 days)  Answer Assessment - Initial Assessment Questions 1. APPEARANCE of RASH: "Describe the rash."      Inbetwe3en shoulder blades on the right side.  Looks like a bruise 2. LOCATION: "Where is the rash located?"      Shoulder blades 3. ONSET: "When did the rash start?"      About 4 days ago 4. ITCHING: "Does the rash itch?" If Yes, ask: "How bad is the itch?"  (Scale 1-10; or mild, moderate, severe)     no 5. PAIN: "Does the rash hurt?" If Yes, ask: "How bad is the pain?"  (Scale 0-10; or none, mild, moderate, severe)    - NONE (0): no pain    - MILD (1-3): doesn't interfere with normal activities     - MODERATE (4-7): interferes with normal activities or awakens from sleep     - SEVERE (8-10): excruciating pain, unable to do any normal activities     Dull ache 6.  OTHER SYMPTOMS: "Do you have any other symptoms?" (e.g., fever)     no  Protocols used: Shingles (Zoster)-A-AH

## 2023-07-27 NOTE — ED Triage Notes (Signed)
Pt reports rash x 1 week, states its bee about a month ago she had some sensations every so often, that was a dull discomfort into the shoulder blade. Last week she noticed the rash From her shoulder to her shoulder blade pain is dull,achy and consistent

## 2023-07-27 NOTE — Discharge Instructions (Addendum)
I have sent over the antiviral medication Valtrex to treat your shingles.  I have also sent gabapentin to take as needed for the nerve pain associated in addition to Tylenol.  We have given you a steroid shot here in clinic today as well to help with your pain and inflammation

## 2023-07-27 NOTE — ED Provider Notes (Signed)
[facility]    CSN: [id number] Arrival date & time: 07/27/23  1022      History   Chief Complaint No chief complaint on file.   HPI Nancy Kirby is a 89 y.o. female.   Patient presenting today with a painful burning stinging rash developing over the past week to the right shoulder blade area and now extending around the flank to the breast.  Denies new products or foods, new medications, chest tightness, shortness of breath, throat itching or swelling.  So far trying Tylenol for the pain with minimal relief.  States it feels like shingles to her.    Past Medical History:  Diagnosis Date   Arthritis    Osteopenia     Patient Active Problem List   Diagnosis Date Noted   Osteopenia of spine 08/18/2016   Insomnia 09/24/2014   Central retinal artery occlusion 05/14/2014   Hyperlipidemia 05/14/2014    Past Surgical History:  Procedure Laterality Date   APPENDECTOMY     BREAST CYST EXCISION Bilateral    BUNIONECTOMY     both feet   ESOPHAGOGASTRODUODENOSCOPY  05/2006   dr Kelvin Cellar   TUBAL LIGATION     YAG LASER APPLICATION Left 09/23/2015   Procedure: YAG LASER APPLICATION;  Surgeon: Susa Simmonds, MD;  Location: [facility];  Service: Ophthalmology;  Laterality: Left;    OB History   No obstetric history on file.      Home Medications    Prior to Admission medications   Medication Sig Start Date End Date Taking? Authorizing Provider  gabapentin (NEURONTIN) 100 MG capsule Take 1 capsule (100 mg total) by mouth 3 (three) times daily as needed. 07/27/23  Yes Particia Nearing, PA-C  valACYclovir (VALTREX) 1000 MG tablet Take 1 tablet (1,000 mg total) by mouth 3 (three) times daily for 7 days. 07/27/23 08/03/23 Yes Particia Nearing, PA-C  calcium carbonate (TUMS - DOSED IN MG ELEMENTAL CALCIUM) 500 MG chewable tablet Chew 1 tablet by mouth daily.    [provider]  cholecalciferol (VITAMIN D3) 25 MCG (1000 UT) tablet Take 1,000 Units by  mouth daily.    [provider]  Elastic Bandages & Supports (ACE ELASTIC BANDAGE 4") MISC by Does not apply route.    [provider]  Incontinence Supply Disposable (POISE PANTILINERS) PADS by Does not apply route.    [provider]  Multiple Vitamins-Minerals (ONE-A-DAY WOMENS VITACRAVES PO) Take by mouth.    [provider]  Multiple Vitamins-Minerals (PRESERVISION AREDS) CAPS Take 1 capsule by mouth 2 (two) times daily. 05/25/22   [provider]  Omega-3 Fatty Acids (FISH OIL PO) Take by mouth.    [provider]  pravastatin (PRAVACHOL) 40 MG tablet TAKE 1 TABLET EVERY DAY 06/09/23   Babs Sciara, MD  vitamin B-12 (CYANOCOBALAMIN) 100 MCG tablet Take 100 mcg by mouth daily.    [provider]    Family History Family History  Problem Relation Age of Onset   Cancer Brother        liver    Social History Social History   Tobacco Use   Smoking status: Never   Smokeless tobacco: Never  Substance Use Topics   Alcohol use: No   Drug use: No     Allergies   Patient has no known allergies.   Review of Systems Review of Systems Per HPI  Physical Exam Triage Vital Signs ED Triage Vitals  Encounter Vitals Group  BP 07/27/23 1031 135/80     Systolic BP Percentile --      Diastolic BP Percentile --      Pulse Rate 07/27/23 1031 (!) 107     Resp 07/27/23 1031 20     Temp 07/27/23 1031 97.8 F (36.6 C)     Temp Source 07/27/23 1031 Oral     SpO2 07/27/23 1031 93 %     Weight --      Height --      Head Circumference --      Peak Flow --      Pain Score 07/27/23 1034 6     Pain Loc --      Pain Education --      Exclude from Growth Chart --    No data found.  Updated Vital Signs BP 135/80 (BP Location: Right Arm)   Pulse (!) 107   Temp 97.8 F (36.6 C) (Oral)   Resp 20   SpO2 93%   Visual Acuity Right Eye Distance:   Left Eye Distance:   Bilateral Distance:    Right Eye Near:   Left  Eye Near:    Bilateral Near:     Physical Exam Vitals and nursing note reviewed.  Constitutional:      Appearance: Normal appearance. She is not ill-appearing.  HENT:     Head: Atraumatic.  Eyes:     Extraocular Movements: Extraocular movements intact.     Conjunctiva/sclera: Conjunctivae normal.  Cardiovascular:     Rate and Rhythm: Normal rate and regular rhythm.     Heart sounds: Normal heart sounds.  Pulmonary:     Effort: Pulmonary effort is normal.     Breath sounds: Normal breath sounds.  Musculoskeletal:        General: Normal range of motion.     Cervical back: Normal range of motion and neck supple.  Skin:    General: Skin is warm and dry.     Findings: Rash present.     Comments: Erythematous clusters with vesicular lesions within extending in a linear pattern from right/mid back at midline all the way to right breast  Neurological:     Mental Status: She is alert and oriented to person, place, and time.  Psychiatric:        Mood and Affect: Mood normal.        Thought Content: Thought content normal.        Judgment: Judgment normal.      UC Treatments / Results  Labs (all labs ordered are listed, but only abnormal results are displayed) Labs Reviewed - No data to display  EKG   Radiology No results found.  Procedures Procedures (including critical care time)  Medications Ordered in UC Medications  dexamethasone (DECADRON) injection 10 mg (10 mg Intramuscular Given 07/27/23 1102)    Initial Impression / Assessment and Plan / UC Course  I have reviewed the triage vital signs and the nursing notes.  Pertinent labs & imaging results that were available during my care of the patient were reviewed by me and considered in my medical decision making (see chart for details).     Consistent with shingles, treat with IM Decadron, Valtrex, gabapentin for pain.  Tylenol as needed additionally.  Return for worsening symptoms.  Final Clinical Impressions(s)  / UC Diagnoses   Final diagnoses:  Herpes zoster without complication     Discharge Instructions      I have sent over the antiviral medication Valtrex  to treat your shingles.  I have also sent gabapentin to take as needed for the nerve pain associated in addition to Tylenol.  We have given you a steroid shot here in clinic today as well to help with your pain and inflammation    ED Prescriptions     Medication Sig Dispense Auth. Provider   gabapentin (NEURONTIN) 100 MG capsule Take 1 capsule (100 mg total) by mouth 3 (three) times daily as needed. 30 capsule Particia Nearing, [place]   valACYclovir (VALTREX) 1000 MG tablet Take 1 tablet (1,000 mg total) by mouth 3 (three) times daily for 7 days. 21 tablet Particia Nearing, [place]      PDMP not reviewed this encounter.   Particia Nearing, [place] 07/27/23 1113

## 2023-08-03 ENCOUNTER — Ambulatory Visit (INDEPENDENT_AMBULATORY_CARE_PROVIDER_SITE_OTHER): Payer: Medicare HMO | Admitting: Family Medicine

## 2023-08-03 VITALS — BP 125/85 | Ht 65.0 in | Wt 148.0 lb

## 2023-08-03 DIAGNOSIS — H3530 Unspecified macular degeneration: Secondary | ICD-10-CM

## 2023-08-03 DIAGNOSIS — B029 Zoster without complications: Secondary | ICD-10-CM

## 2023-08-03 DIAGNOSIS — Z79899 Other long term (current) drug therapy: Secondary | ICD-10-CM | POA: Diagnosis not present

## 2023-08-03 DIAGNOSIS — E785 Hyperlipidemia, unspecified: Secondary | ICD-10-CM | POA: Diagnosis not present

## 2023-08-03 NOTE — Progress Notes (Signed)
   Subjective:    Patient ID: Nancy Kirby, female    DOB: [birth year], 89 y.o.   MRN: [id number]  HPI  Patient arrives for a follow up on hyperlipidemia. Patient has developed macular degeneration and is taking injections in eyes. Patient also dx with shingles least week. Discussed the use of AI scribe software for clinical note transcription with the patient, who gave verbal consent to proceed.  History of Present Illness   The patient, with a history of macular degeneration, presents with progressive vision loss. She noticed a change in her vision when observing trees, which appeared to have wavy lines. 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 Despite the treatment, the patient reports difficulty with reading, even with the aid of magnifying glasses, and struggles with discerning images on the television.  In addition to her vision problems, the patient has recently developed shingles. The rash has scabbed over, but she reports a persistent throbbing sensation and discomfort, particularly between the shoulder blades. The discomfort began as spasms under the arm about a month and a half ago and has since migrated downwards. The patient is unable to lay on her right side due to the discomfort and has been managing the pain with Tylenol.  The patient also reports a decrease in energy, likening the fatigue to her experience with COVID. Despite these health issues, the patient is able to navigate her apartment and perform daily activities without significant difficulty. She has been taking cholesterol medication, vitamin D3, fish oil, and B12. The patient has discontinued the use of fish oil on her own accord.      Review of Systems     Objective:   Physical Exam Physical Exam   CARDIOVASCULAR: Normal heart sounds. SKIN: Scabbed lesions observed between the shoulder blades.     General-in no  acute distress Eyes-no discharge Lungs-respiratory rate normal, CTA CV-no murmurs,RRR Extremities skin warm dry no edema Neuro grossly normal Behavior normal, alert        Assessment & Plan:   Assessment and Plan    Macular Degeneration Progressive vision loss with difficulty reading and watching television despite use of magnifying glasses. Receiving injections every eight weeks to slow progression. -Continue current treatment plan with ophthalmologist.  Herpes Zoster (Shingles) Recent outbreak with residual discomfort and throbbing sensation between shoulder blades. Completed course of valacyclovir. -Discontinue gabapentin due to side effects. -Manage discomfort with Tylenol 500mg  as needed.  General Health Maintenance -Completed flu shot in October 2024. -Continue current medications including cholesterol medication, Vitamin D3, and B12. -Discontinue fish oil supplement. -Order routine blood work to be completed in January 2025. -Schedule follow-up appointment in six months.

## 2023-08-03 NOTE — Addendum Note (Signed)
Addended by: Margaretha Sheffield on: 08/03/2023 03:32 PM   Modules accepted: Orders

## 2023-09-23 DIAGNOSIS — Z79899 Other long term (current) drug therapy: Secondary | ICD-10-CM | POA: Diagnosis not present

## 2023-09-23 DIAGNOSIS — E785 Hyperlipidemia, unspecified: Secondary | ICD-10-CM | POA: Diagnosis not present

## 2023-09-24 ENCOUNTER — Encounter: Payer: Self-pay | Admitting: Family Medicine

## 2023-09-24 LAB — LIPID PANEL
Chol/HDL Ratio: 2.9 {ratio} (ref 0.0–4.4)
Cholesterol, Total: 187 mg/dL (ref 100–199)
HDL: 65 mg/dL (ref >39)
LDL Chol Calc (NIH): 106 mg/dL — ABNORMAL HIGH (ref 0–99)
Triglycerides: 89 mg/dL (ref 0–149)
VLDL Cholesterol Cal: 16 mg/dL (ref 5–40)

## 2023-09-24 LAB — HEPATIC FUNCTION PANEL
ALT: 12 [IU]/L (ref 0–32)
AST: 23 [IU]/L (ref 0–40)
Albumin: 4 g/dL (ref 3.6–4.6)
Alkaline Phosphatase: 100 [IU]/L (ref 44–121)
Bilirubin Total: 0.6 mg/dL (ref 0.0–1.2)
Bilirubin, Direct: 0.21 mg/dL (ref 0.00–0.40)
Total Protein: 6.3 g/dL (ref 6.0–8.5)

## 2023-09-24 LAB — BASIC METABOLIC PANEL
BUN/Creatinine Ratio: 21 (ref 12–28)
BUN: 18 mg/dL (ref 10–36)
CO2: 25 mmol/L (ref 20–29)
Calcium: 9.1 mg/dL (ref 8.7–10.3)
Chloride: 101 mmol/L (ref 96–106)
Creatinine, Ser: 0.85 mg/dL (ref 0.57–1.00)
Glucose: 92 mg/dL (ref 70–99)
Potassium: 4.3 mmol/L (ref 3.5–5.2)
Sodium: 142 mmol/L (ref 134–144)
eGFR: 63 mL/min/{1.73_m2} (ref >59)

## 2023-09-24 NOTE — Progress Notes (Signed)
Please mail to patient thank you ?

## 2024-02-03 ENCOUNTER — Encounter: Payer: Self-pay | Admitting: Family Medicine

## 2024-02-03 ENCOUNTER — Ambulatory Visit: Payer: Medicare HMO | Admitting: Family Medicine

## 2024-02-03 VITALS — BP 123/73 | HR 85 | Temp 99.7°F | Ht 65.0 in | Wt 148.8 lb

## 2024-02-03 DIAGNOSIS — E782 Mixed hyperlipidemia: Secondary | ICD-10-CM | POA: Diagnosis not present

## 2024-02-03 DIAGNOSIS — H3530 Unspecified macular degeneration: Secondary | ICD-10-CM

## 2024-02-03 MED ORDER — PRAVASTATIN SODIUM 40 MG PO TABS: 40.0000 mg | ORAL_TABLET | Freq: Every day | ORAL | 3 refills | Status: DC

## 2024-02-03 MED ORDER — IPRATROPIUM BROMIDE 0.03 % NA SOLN: 2.0000 | Freq: Two times a day (BID) | NASAL | 12 refills | Status: AC

## 2024-02-03 NOTE — Progress Notes (Signed)
   Subjective:    Patient ID: [name], female    DOB: Nov 08, 1926, 89 y.o.   MRN: [id number]  HPI Discussed the use of AI scribe software for clinical note transcription with the patient, who gave verbal consent to proceed.  History of Present Illness   Nancy Kirby is a 89 year old female who presents with dizziness and off-balance sensation.  She has been experiencing dizziness and a sensation of being off-balance for the last three days. The dizziness occurs when she gets up, and she manages it by sitting on the side of the bed and using her cane for support. The dizziness is improving but has not completely resolved. Blood pressure readings have fluctuated between 120 and 109 over the past few days. No room spinning sensation is reported, but she feels unsteady when getting up.  She has a history of macular degeneration, initially self-diagnosed after noticing wavy lines on television. She received three injections to prolong her vision but discontinued them due to increased copayment costs. Currently, she cannot read but can see boundaries and watch television. She listens to audio content.  She reports a clear nasal discharge, which she attributes to allergies. She carries tissues due to the persistent nature of the discharge.  Her current medications include pravastatin , and she has added over-the-counter supplements to her regimen. She receives financial assistance for these supplements from her retirement benefits.  She lives independently in a private dining area within a community setting, where she interacts well with other residents. She uses a cane daily and walks in the hallways but does not engage in formal exercise. She has not eaten outside the community dining area since moving there.        Review of Systems     Objective:   Physical Exam  General-in no acute distress Eyes-no discharge Lungs-respiratory rate normal, CTA CV-no murmurs,RRR Extremities skin  warm dry no edema Neuro grossly normal Behavior normal, alert       Assessment & Plan:  Assessment and Plan    Off balance Intermittent off balance with improving symptoms. Possible inner ear issue considered. Orthostatic blood pressure changes not unusual. Current blood pressure stable. - Encourage regular walking and healthy eating. - Advise low-sodium diet.  Senile rhinorrhea Clear nasal discharge likely due to senile rhinorrhea. Discussed Atrovent nasal spray and loratadine for symptom management. - Prescribe Atrovent nasal spray for one month trial. - Consider loratadine for allergy symptoms.  Age-related macular degeneration Diagnosed with macular degeneration. Stopped injections due to cost and uncertain benefits. Vision allows for basic recognition but not reading. Patient not concerned about vision loss. - Consider audio books or devices for listening to books.  Hyperlipidemia Managed with pravastatin .  General Health Maintenance Blood work last done in January. Recommended annual repetition. - Schedule blood work and follow-up visit in January.

## 2024-03-29 ENCOUNTER — Other Ambulatory Visit: Payer: Self-pay | Admitting: Family Medicine

## 2024-03-29 DIAGNOSIS — E782 Mixed hyperlipidemia: Secondary | ICD-10-CM

## 2024-04-11 DIAGNOSIS — M79674 Pain in right toe(s): Secondary | ICD-10-CM | POA: Diagnosis not present

## 2024-04-11 DIAGNOSIS — L84 Corns and callosities: Secondary | ICD-10-CM | POA: Diagnosis not present

## 2024-04-11 DIAGNOSIS — M79675 Pain in left toe(s): Secondary | ICD-10-CM | POA: Diagnosis not present

## 2024-04-11 DIAGNOSIS — I70203 Unspecified atherosclerosis of native arteries of extremities, bilateral legs: Secondary | ICD-10-CM | POA: Diagnosis not present

## 2024-04-11 DIAGNOSIS — B351 Tinea unguium: Secondary | ICD-10-CM | POA: Diagnosis not present

## 2024-04-25 DIAGNOSIS — L97521 Non-pressure chronic ulcer of other part of left foot limited to breakdown of skin: Secondary | ICD-10-CM | POA: Diagnosis not present

## 2024-05-04 DIAGNOSIS — H04123 Dry eye syndrome of bilateral lacrimal glands: Secondary | ICD-10-CM | POA: Diagnosis not present

## 2024-06-06 DIAGNOSIS — Z1231 Encounter for screening mammogram for malignant neoplasm of breast: Secondary | ICD-10-CM | POA: Diagnosis not present

## 2024-06-06 DIAGNOSIS — R92323 Mammographic fibroglandular density, bilateral breasts: Secondary | ICD-10-CM | POA: Diagnosis not present

## 2024-06-26 ENCOUNTER — Telehealth: Payer: Self-pay

## 2024-06-26 NOTE — Telephone Encounter (Signed)
 Called pt and informed her per mammogram report from [facility], which states additional imaging needed of the right breast. She states she will give them a call to schedule the appt. With [facility]

## 2024-06-28 ENCOUNTER — Telehealth: Payer: Self-pay | Admitting: *Deleted

## 2024-06-28 NOTE — Telephone Encounter (Addendum)
 Front desk sent it today to be scanned, not posted yet. Did see in care everywhere for your review.  I tried calling patient , no answer or voicemail available.

## 2024-06-28 NOTE — Telephone Encounter (Signed)
 Thank you-that was helpful According to the mammogram and care everywhere it is in the right breast with abnormality Please order diagnostic mammogram with ultrasound of the right breast due to abnormal mammogram This can be ordered through radiology at [facility] The patient lives at [place] it would be important when this is scheduled that she be notified

## 2024-06-28 NOTE — Telephone Encounter (Signed)
 1.  There was a mammogram paper that came to us  from outside facility I believe [facility] It came across my desk, it stated additional images were necessary, I had Betsy call the patient She stated that she would follow-up for additional images  So in order for us  to order a mammogram I would need that form/original mammogram reading back for me to look at-please locate it and bring it back to me-then we can order additional testing (It has not been scanned into the system yet)

## 2024-06-28 NOTE — Telephone Encounter (Signed)
 Copied from CRM #[id number]. Topic: General - Other >> Jun 27, 2024  9:48 AM Corin V wrote: Reason for CRM: Patient called Arland with [facility] and stated she needed a mammogram as she had gotten a tag on her breast 2 weeks ago. Arland stated she would need an order for a diagnostic mammogram to schedule the patient. Please place order if appropriate or call patient to get clarification. Arland had been unsure if patient was confused or if there was a mammogram needed as she couldn't get in contact with anyone at patient's facility to verify that she had been seen by mobile clinic. Please call Arland at [phone] for follow up questions.

## 2024-06-29 ENCOUNTER — Other Ambulatory Visit: Payer: Self-pay

## 2024-06-29 DIAGNOSIS — R928 Other abnormal and inconclusive findings on diagnostic imaging of breast: Secondary | ICD-10-CM

## 2024-06-29 NOTE — Telephone Encounter (Signed)
 Referral has been put in for radiology

## 2024-07-03 ENCOUNTER — Other Ambulatory Visit: Payer: Self-pay

## 2024-07-03 DIAGNOSIS — R928 Other abnormal and inconclusive findings on diagnostic imaging of breast: Secondary | ICD-10-CM

## 2024-07-03 NOTE — Telephone Encounter (Signed)
 Orders placed as indicated per provider.

## 2024-07-04 NOTE — Telephone Encounter (Signed)
 Scheduled: 07/18/2024  2:20 PM at [facility]

## 2024-07-12 ENCOUNTER — Telehealth: Payer: Self-pay

## 2024-07-12 NOTE — Telephone Encounter (Signed)
 Called and susan was not available per [facility] imaging staff, will have to call back in the morning.  Copied from CRM [id number]. Topic: Clinical - Request for Lab/Test Order >> Jul 12, 2024 12:51 PM Sophia H wrote: Reason for CRM: Spoke with [facility]. States that they received orders for a mammogram for the patient only on one side, just wanted to make sure that is correct based on the patients age and the last time they are aware of her having a mammogram. Please reach out # [phone] Devere will be at lunch from 1-2p.

## 2024-07-13 NOTE — Telephone Encounter (Signed)
 I was able to speak with Nancy Kirby The patient had her mammogram through the [facility] travel mammography truck Nancy Kirby has requested the images and the printed report she will confirm that it is the right side and then the patient is scheduled for a diagnostic mammogram and ultrasound await the results of this

## 2024-07-17 ENCOUNTER — Inpatient Hospital Stay
Admission: RE | Admit: 2024-07-17 | Discharge: 2024-07-17 | Disposition: A | Payer: Self-pay | Source: Ambulatory Visit | Attending: Family Medicine | Admitting: Family Medicine

## 2024-07-17 ENCOUNTER — Other Ambulatory Visit: Payer: Self-pay | Admitting: Family Medicine

## 2024-07-17 DIAGNOSIS — R928 Other abnormal and inconclusive findings on diagnostic imaging of breast: Secondary | ICD-10-CM

## 2024-07-18 ENCOUNTER — Ambulatory Visit (HOSPITAL_COMMUNITY)
Admission: RE | Admit: 2024-07-18 | Discharge: 2024-07-18 | Disposition: A | Discharge status: Home / Self Care | Source: Ambulatory Visit | Attending: Family Medicine | Admitting: Family Medicine

## 2024-07-18 ENCOUNTER — Encounter (HOSPITAL_COMMUNITY): Payer: Self-pay

## 2024-07-18 DIAGNOSIS — R928 Other abnormal and inconclusive findings on diagnostic imaging of breast: Secondary | ICD-10-CM | POA: Insufficient documentation

## 2024-07-18 DIAGNOSIS — R92321 Mammographic fibroglandular density, right breast: Secondary | ICD-10-CM | POA: Diagnosis not present

## 2024-08-08 ENCOUNTER — Other Ambulatory Visit: Payer: Self-pay | Admitting: Family Medicine

## 2024-08-08 DIAGNOSIS — Z79899 Other long term (current) drug therapy: Secondary | ICD-10-CM

## 2024-08-08 DIAGNOSIS — D7589 Other specified diseases of blood and blood-forming organs: Secondary | ICD-10-CM

## 2024-08-08 DIAGNOSIS — E782 Mixed hyperlipidemia: Secondary | ICD-10-CM

## 2024-08-09 ENCOUNTER — Ambulatory Visit: Payer: Self-pay | Admitting: Family Medicine

## 2024-08-09 LAB — CBC WITH DIFFERENTIAL/PLATELET
Basophils Absolute: 0.1 x10E3/uL (ref 0.0–0.2)
Basos: 1 %
EOS (ABSOLUTE): 0.2 x10E3/uL (ref 0.0–0.4)
Eos: 2 %
Hematocrit: 40.4 % (ref 34.0–46.6)
Hemoglobin: 12.9 g/dL (ref 11.1–15.9)
Immature Grans (Abs): 0 x10E3/uL (ref 0.0–0.1)
Immature Granulocytes: 0 %
Lymphocytes Absolute: 1.8 x10E3/uL (ref 0.7–3.1)
Lymphs: 21 %
MCH: 31.7 pg (ref 26.6–33.0)
MCHC: 31.9 g/dL (ref 31.5–35.7)
MCV: 99 fL — ABNORMAL HIGH (ref 79–97)
Monocytes Absolute: 0.8 x10E3/uL (ref 0.1–0.9)
Monocytes: 10 %
Neutrophils Absolute: 5.4 x10E3/uL (ref 1.4–7.0)
Neutrophils: 66 %
Platelets: 331 x10E3/uL (ref 150–450)
RBC: 4.07 x10E6/uL (ref 3.77–5.28)
RDW: 13 % (ref 11.7–15.4)
WBC: 8.2 x10E3/uL (ref 3.4–10.8)

## 2024-08-09 LAB — LIPID PANEL
Chol/HDL Ratio: 2.7 ratio (ref 0.0–4.4)
Cholesterol, Total: 172 mg/dL (ref 100–199)
HDL: 64 mg/dL (ref >39)
LDL Chol Calc (NIH): 94 mg/dL (ref 0–99)
Triglycerides: 74 mg/dL (ref 0–149)
VLDL Cholesterol Cal: 14 mg/dL (ref 5–40)

## 2024-08-09 LAB — HEPATIC FUNCTION PANEL
ALT: 15 IU/L (ref 0–32)
AST: 23 IU/L (ref 0–40)
Albumin: 3.9 g/dL (ref 3.6–4.6)
Alkaline Phosphatase: 91 IU/L (ref 48–129)
Bilirubin Total: 0.6 mg/dL (ref 0.0–1.2)
Bilirubin, Direct: 0.21 mg/dL (ref 0.00–0.40)
Total Protein: 6.1 g/dL (ref 6.0–8.5)

## 2024-08-09 LAB — BASIC METABOLIC PANEL WITH GFR
BUN/Creatinine Ratio: 20 (ref 12–28)
BUN: 17 mg/dL (ref 10–36)
CO2: 25 mmol/L (ref 20–29)
Calcium: 9.1 mg/dL (ref 8.7–10.3)
Chloride: 100 mmol/L (ref 96–106)
Creatinine, Ser: 0.85 mg/dL (ref 0.57–1.00)
Glucose: 92 mg/dL (ref 70–99)
Potassium: 4.5 mmol/L (ref 3.5–5.2)
Sodium: 140 mmol/L (ref 134–144)
eGFR: 63 mL/min/1.73 (ref >59)

## 2024-09-05 ENCOUNTER — Ambulatory Visit: Admitting: Family Medicine

## 2024-09-05 VITALS — BP 132/86 | HR 93 | Temp 98.4°F | Ht 65.0 in

## 2024-09-05 DIAGNOSIS — Z79899 Other long term (current) drug therapy: Secondary | ICD-10-CM | POA: Diagnosis not present

## 2024-09-05 DIAGNOSIS — H348122 Central retinal vein occlusion, left eye, stable: Secondary | ICD-10-CM | POA: Diagnosis not present

## 2024-09-05 DIAGNOSIS — M722 Plantar fascial fibromatosis: Secondary | ICD-10-CM

## 2024-09-05 DIAGNOSIS — E782 Mixed hyperlipidemia: Secondary | ICD-10-CM | POA: Diagnosis not present

## 2024-09-05 MED ORDER — PRAVASTATIN SODIUM 40 MG PO TABS: 40.0000 mg | ORAL_TABLET | Freq: Every day | ORAL | 3 refills | Status: AC

## 2024-09-05 NOTE — Progress Notes (Signed)
 "  Subjective:    Patient ID: Nancy Kirby, female    DOB: [birth year], 89 y.o.   MRN: [id number]  HPI Patient is in room 6 Patient has macular edema with degenerative changes blindness in the left eye She has been followed by eye specialist They were doing injections but the co-pay of the injections went up to $460 She therefore stopped doing the injections She does take her cholesterol medicine regular basis She tries to eat healthy She stays active No accidents injuries or falls  Patient is here for a 7 month follow up      Review of Systems     Objective:   Physical Exam General-in no acute distress Eyes-no discharge Lungs-respiratory rate normal, CTA CV-no murmurs,RRR Extremities skin warm dry slight lower leg edema bilateral Neuro grossly normal Behavior normal, alert  Results for orders placed or performed in visit on 08/08/24  CBC with Differential/Platelet   Collection Time: 08/08/24  9:10 AM  Result Value Ref Range   WBC 8.2 3.4 - 10.8 x10E3/uL   RBC 4.07 3.77 - 5.28 x10E6/uL   Hemoglobin 12.9 11.1 - 15.9 g/dL   Hematocrit 59.5 65.9 - 46.6 %   MCV 99 (H) 79 - 97 fL   MCH 31.7 26.6 - 33.0 pg   MCHC 31.9 31.5 - 35.7 g/dL   RDW 86.9 88.2 - 84.5 %   Platelets 331 150 - 450 x10E3/uL   Neutrophils 66 Not Estab. %   Lymphs 21 Not Estab. %   Monocytes 10 Not Estab. %   Eos 2 Not Estab. %   Basos 1 Not Estab. %   Neutrophils Absolute 5.4 1.4 - 7.0 x10E3/uL   Lymphocytes Absolute 1.8 0.7 - 3.1 x10E3/uL   Monocytes Absolute 0.8 0.1 - 0.9 x10E3/uL   EOS (ABSOLUTE) 0.2 0.0 - 0.4 x10E3/uL   Basophils Absolute 0.1 0.0 - 0.2 x10E3/uL   Immature Granulocytes 0 Not Estab. %   Immature Grans (Abs) 0.0 0.0 - 0.1 x10E3/uL  Lipid Panel   Collection Time: 08/08/24  9:10 AM  Result Value Ref Range   Cholesterol, Total 172 100 - 199 mg/dL   Triglycerides 74 0 - 149 mg/dL   HDL 64 >60 mg/dL   VLDL Cholesterol Cal 14 5 - 40 mg/dL   LDL Chol Calc (NIH) 94 0 - 99 mg/dL    Chol/HDL Ratio 2.7 0.0 - 4.4 ratio  Hepatic function panel   Collection Time: 08/08/24  9:10 AM  Result Value Ref Range   Total Protein 6.1 6.0 - 8.5 g/dL   Albumin 3.9 3.6 - 4.6 g/dL   Bilirubin Total 0.6 0.0 - 1.2 mg/dL   Bilirubin, Direct 9.78 0.00 - 0.40 mg/dL   Alkaline Phosphatase 91 48 - 129 IU/L   AST 23 0 - 40 IU/L   ALT 15 0 - 32 IU/L  Basic metabolic panel with GFR   Collection Time: 08/08/24  9:10 AM  Result Value Ref Range   Glucose 92 70 - 99 mg/dL   BUN 17 10 - 36 mg/dL   Creatinine, Ser 9.14 0.57 - 1.00 mg/dL   eGFR 63 >40 fO/fpw/8.26   BUN/Creatinine Ratio 20 12 - 28   Sodium 140 134 - 144 mmol/L   Potassium 4.5 3.5 - 5.2 mmol/L   Chloride 100 96 - 106 mmol/L   CO2 25 20 - 29 mmol/L   Calcium 9.1 8.7 - 10.3 mg/dL   Labs were reviewed with patient in detail  Assessment & Plan:  Pedal edema-not enough to add medication but if her situation does get worse I would recommend medication Hyperlipidemia-good control continue current measures High risk med-liver functions look good 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

## 2025-03-06 ENCOUNTER — Ambulatory Visit: Admitting: Family Medicine
